# Patient Record
Sex: Female | Born: 1970 | Race: Black or African American | Hispanic: No | Marital: Married | Smoking: Never smoker
Health system: Southern US, Community
[De-identification: ages and names within clinical notes are randomized; demographics above are authoritative.]

## PROBLEM LIST (undated history)

## (undated) DIAGNOSIS — D509 Iron deficiency anemia, unspecified: Secondary | ICD-10-CM

## (undated) DIAGNOSIS — I10 Essential (primary) hypertension: Secondary | ICD-10-CM

## (undated) DIAGNOSIS — Z8742 Personal history of other diseases of the female genital tract: Secondary | ICD-10-CM

## (undated) DIAGNOSIS — M810 Age-related osteoporosis without current pathological fracture: Secondary | ICD-10-CM

## (undated) HISTORY — PX: LAPAROSCOPY: SHX197

---

## 2007-10-17 ENCOUNTER — Ambulatory Visit: Payer: Self-pay | Admitting: Internal Medicine

## 2007-10-26 ENCOUNTER — Ambulatory Visit: Payer: Self-pay | Admitting: Internal Medicine

## 2008-04-30 ENCOUNTER — Ambulatory Visit: Payer: Self-pay | Admitting: General Surgery

## 2008-12-27 ENCOUNTER — Ambulatory Visit: Payer: Self-pay | Admitting: Family Medicine

## 2010-02-04 ENCOUNTER — Ambulatory Visit: Payer: Self-pay | Admitting: Family Medicine

## 2010-02-06 ENCOUNTER — Ambulatory Visit: Payer: Self-pay | Admitting: Nurse Practitioner

## 2016-05-22 ENCOUNTER — Other Ambulatory Visit: Payer: Self-pay | Admitting: Obstetrics and Gynecology

## 2016-05-22 DIAGNOSIS — Z1231 Encounter for screening mammogram for malignant neoplasm of breast: Secondary | ICD-10-CM

## 2016-06-08 ENCOUNTER — Encounter (INDEPENDENT_AMBULATORY_CARE_PROVIDER_SITE_OTHER): Payer: Self-pay

## 2016-06-08 ENCOUNTER — Ambulatory Visit
Admission: RE | Admit: 2016-06-08 | Discharge: 2016-06-08 | Disposition: A | Discharge status: Home / Self Care | Source: Ambulatory Visit | Attending: Obstetrics and Gynecology | Admitting: Obstetrics and Gynecology

## 2016-06-08 DIAGNOSIS — Z1231 Encounter for screening mammogram for malignant neoplasm of breast: Secondary | ICD-10-CM

## 2016-06-08 DIAGNOSIS — R928 Other abnormal and inconclusive findings on diagnostic imaging of breast: Secondary | ICD-10-CM | POA: Insufficient documentation

## 2016-06-10 ENCOUNTER — Other Ambulatory Visit: Payer: Self-pay | Admitting: Obstetrics and Gynecology

## 2016-06-10 DIAGNOSIS — R928 Other abnormal and inconclusive findings on diagnostic imaging of breast: Secondary | ICD-10-CM

## 2016-06-10 DIAGNOSIS — N6320 Unspecified lump in the left breast, unspecified quadrant: Secondary | ICD-10-CM

## 2016-06-23 ENCOUNTER — Ambulatory Visit
Admission: RE | Admit: 2016-06-23 | Discharge: 2016-06-23 | Disposition: A | Discharge status: Home / Self Care | Source: Ambulatory Visit | Attending: Obstetrics and Gynecology | Admitting: Obstetrics and Gynecology

## 2016-06-23 DIAGNOSIS — N6320 Unspecified lump in the left breast, unspecified quadrant: Secondary | ICD-10-CM | POA: Diagnosis not present

## 2016-06-23 DIAGNOSIS — R928 Other abnormal and inconclusive findings on diagnostic imaging of breast: Secondary | ICD-10-CM

## 2016-06-25 ENCOUNTER — Other Ambulatory Visit: Payer: Self-pay | Admitting: Obstetrics and Gynecology

## 2016-06-25 DIAGNOSIS — N6002 Solitary cyst of left breast: Secondary | ICD-10-CM

## 2016-12-25 ENCOUNTER — Other Ambulatory Visit

## 2016-12-25 ENCOUNTER — Ambulatory Visit: Attending: Obstetrics and Gynecology

## 2017-04-07 ENCOUNTER — Ambulatory Visit
Admission: RE | Admit: 2017-04-07 | Discharge: 2017-04-07 | Disposition: A | Discharge status: Home / Self Care | Source: Ambulatory Visit | Attending: Obstetrics and Gynecology | Admitting: Obstetrics and Gynecology

## 2017-04-07 DIAGNOSIS — N6002 Solitary cyst of left breast: Secondary | ICD-10-CM

## 2017-04-07 DIAGNOSIS — N6323 Unspecified lump in the left breast, lower outer quadrant: Secondary | ICD-10-CM | POA: Diagnosis not present

## 2017-04-09 ENCOUNTER — Other Ambulatory Visit: Payer: Self-pay

## 2017-04-09 ENCOUNTER — Ambulatory Visit
Admission: EM | Admit: 2017-04-09 | Discharge: 2017-04-09 | Disposition: A | Discharge status: Home / Self Care | Attending: Family Medicine | Admitting: Family Medicine

## 2017-04-09 ENCOUNTER — Encounter: Payer: Self-pay | Admitting: *Deleted

## 2017-04-09 ENCOUNTER — Ambulatory Visit (INDEPENDENT_AMBULATORY_CARE_PROVIDER_SITE_OTHER)

## 2017-04-09 DIAGNOSIS — M25532 Pain in left wrist: Secondary | ICD-10-CM

## 2017-04-09 HISTORY — DX: Essential (primary) hypertension: I10

## 2017-04-09 MED ORDER — MELOXICAM 15 MG PO TABS: 15.0000 mg | ORAL_TABLET | Freq: Every day | ORAL | 0 refills | Status: DC | PRN

## 2017-04-09 NOTE — Discharge Instructions (Signed)
Take medication as prescribed. Rest. Drink plenty of fluids.  Use a wrist splint as discussed.  Ice and elevate.  Avoid over strenuous repetitive activity.  Follow-up with orthopedic in 1 week for any continued pain or swelling.  Follow up with your primary care physician this week as needed. Return to Urgent care for new or worsening concerns.

## 2017-04-09 NOTE — ED Triage Notes (Signed)
Patient started having left wrist pain 1 month ago. Patient is not sure of mechanism of injury.

## 2017-04-09 NOTE — ED Provider Notes (Signed)
[facility] ____________________________________________  Time seen: Approximately 5:50 PM  I have reviewed the triage vital signs and the nursing notes.   HISTORY  Chief Complaint Wrist Injury   HPI Casey Hatfield is a 46 y.o. female presenting for evaluation of intermittent left wrist pain that is been present for at least 1 month.  States pain comes and goes, more aggravated by repetitive movements.  Denies any associated numbness, paresthesias, decreased strength, redness, skin changes.  Reports there has been some swelling in the same area.  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  Denies associated pain radiation, fall, direct trauma or known trigger.  Reports right-hand dominant.  Denies history of similar in the past.  Reports she has been recently seen by her primary care and had primary care look at wrist as well due to the swelling, and it was suspected subcutaneous tissue.  Patient again reports otherwise feels well denies other complaints.  No over-the-counter medications taken today for the same complaints.  Denies chest pain, shortness of breath, abdominal pain, or rash. Denies recent sickness. Denies recent antibiotic use.   Marisue IvanLinthavong, Kanhka, MD: PCP Patient's last menstrual period was 03/16/2017.Denies pregnancy.    Past Medical History:  Diagnosis Date  . Hypertension     There are no active problems to display for this patient.   Past Surgical History:  Procedure Laterality Date  . LAPAROSCOPY       No current facility-administered medications for this encounter.   Current Outpatient Medications:  .  Ferrous Sulfate (IRON) 325 (65 Fe) MG TABS, Take 1 tablet by mouth daily., Disp: , Rfl:  .  hydrochlorothiazide (MICROZIDE) 12.5 MG capsule, Take 12.5 mg by mouth daily., Disp: , Rfl:  .  Prenatal Vit-Fe Fumarate-FA (PRENATAL MULTIVITAMIN) TABS tablet, Take 1 tablet by mouth daily at 12  noon., Disp: , Rfl:  .  meloxicam (MOBIC) 15 MG tablet, Take 1 tablet (15 mg total) by mouth daily as needed., Disp: 10 tablet, Rfl: 0  Allergies Penicillins and Shellfish allergy  Family History  Problem Relation Age of Onset  . Breast cancer Mother 4855    Social History Social History   Tobacco Use  . Smoking status: Never Smoker  . Smokeless tobacco: Never Used  Substance Use Topics  . Alcohol use: No    Frequency: Never  . Drug use: No    Review of Systems Constitutional: No fever/chills Cardiovascular: Denies chest pain. Respiratory: Denies shortness of breath. Gastrointestinal: No abdominal pain.   Genitourinary: Negative for dysuria. Musculoskeletal: Negative for back pain. As above.  Skin: Negative for rash.  ____________________________________________   PHYSICAL EXAM:  VITAL SIGNS: ED Triage Vitals  Enc Vitals Group     BP 04/09/17 1618 (!) 141/73     Pulse Rate 04/09/17 1618 77     Resp 04/09/17 1618 16     Temp 04/09/17 1618 98.8 F (37.1 C)     Temp Source 04/09/17 1618 Oral     SpO2 04/09/17 1618 100 %     Weight 04/09/17 1619 230 lb (104.3 kg)     Height 04/09/17 1619 5\' 5"  (1.651 m)     Head Circumference --      Peak Flow --      Pain Score 04/09/17 1619 0     Pain Loc --      Pain Edu? --      Excl. in [place]? --  Constitutional: Alert and oriented. Well appearing and in no acute distress. Cardiovascular: Normal rate, regular rhythm. Grossly normal heart sounds.  Good peripheral circulation. Respiratory: Normal respiratory effort without tachypnea nor retractions. Breath sounds are clear and equal bilaterally. No wheezes, rales, rhonchi. Musculoskeletal:. No midline cervical, thoracic or lumbar tenderness to palpation. Bilateral distal radial pulses equal and easily palpated.  Bilateral hand grip strong and equal.   Except: Left wrist mild diffuse tenderness to palpation with increased pain during resisted flexion and extension, full range  of motion present, no erythema, no break in skin, left hand normal distal sensation and capillary refill, no motor or tendon deficit noted to left hand, localized area in the dorsal left wrist of localized edema, body habitus consistent with similar appearance to right dorsal wrist however left more edematous, left upper extremity otherwise nontender.  Negative Tinel's, negative Phalen's. Neurologic:  Normal speech and language. No gross focal neurologic deficits are appreciated. Speech is normal. No gait instability.  Skin:  Skin is warm, dry and intact. No rash noted. Psychiatric: Mood and affect are normal. Speech and behavior are normal. Patient exhibits appropriate insight and judgment   ___________________________________________   LABS (all labs ordered are listed, but only abnormal results are displayed)  Labs Reviewed - No data to display  RADIOLOGY  Dg Wrist Complete Left  Result Date: 04/09/2017 CLINICAL DATA:  Intermittent pain and swelling EXAM: LEFT WRIST - COMPLETE 3+ VIEW COMPARISON:  None. FINDINGS: There is no evidence of fracture or dislocation. There is no evidence of arthropathy or other focal bone abnormality. Soft tissues are unremarkable. IMPRESSION: Negative. Electronically Signed   By: Jasmine PangKim  Fujinaga M.D.   On: 04/09/2017 18:08   ____________________________________________   PROCEDURES Procedures     INITIAL IMPRESSION / ASSESSMENT AND PLAN / ED COURSE  Pertinent labs & imaging results that were available during my care of the patient were reviewed by me and considered in my medical decision making (see chart for details).  Well appearing patient.  No acute distress.  Intermittent left wrist pain over 1 month.  Denies known trauma.  Repetitive motions.  Suspect strain and tendinitis.  Discussed in detail with patient supportive care.  Left wrist x-ray negative per radiologist.  Encouraged ice, elevation, cockup Velcro splint to encourage rest, stretching,  daily Mobic.  Discussed need to follow-up for further evaluation and treatment with orthopedic if swelling and pain continue.  Information given.Discussed indication, risks and benefits of medications with patient.  Discussed follow up with Primary care physician this week as needed. Discussed follow up and return parameters including no resolution or any worsening concerns. Patient verbalized understanding and agreed to plan.   ____________________________________________   FINAL CLINICAL IMPRESSION(S) / ED DIAGNOSES  Final diagnoses:  Left wrist pain     ED Discharge Orders        Ordered    meloxicam (MOBIC) 15 MG tablet  Daily PRN     04/09/17 1830       Note: This dictation was prepared with Dragon dictation along with smaller phrase technology. Any transcriptional errors that result from this process are unintentional.         Renford DillsMiller, Keirstyn Aydt, NP 04/09/17 [phone]

## 2017-06-30 ENCOUNTER — Other Ambulatory Visit: Payer: Self-pay | Admitting: Obstetrics and Gynecology

## 2017-06-30 DIAGNOSIS — Z1231 Encounter for screening mammogram for malignant neoplasm of breast: Secondary | ICD-10-CM

## 2017-06-30 DIAGNOSIS — N6320 Unspecified lump in the left breast, unspecified quadrant: Secondary | ICD-10-CM

## 2017-07-16 ENCOUNTER — Other Ambulatory Visit

## 2017-07-16 ENCOUNTER — Encounter

## 2017-08-24 ENCOUNTER — Ambulatory Visit
Admission: RE | Admit: 2017-08-24 | Discharge: 2017-08-24 | Disposition: A | Discharge status: Home / Self Care | Source: Ambulatory Visit | Attending: Obstetrics and Gynecology | Admitting: Obstetrics and Gynecology

## 2017-08-24 DIAGNOSIS — Z1231 Encounter for screening mammogram for malignant neoplasm of breast: Secondary | ICD-10-CM

## 2017-08-24 DIAGNOSIS — N6320 Unspecified lump in the left breast, unspecified quadrant: Secondary | ICD-10-CM

## 2017-12-07 IMAGING — MG MM DIGITAL DIAGNOSTIC UNILAT*L* W/ TOMO W/ CAD
6 series · 6 of 14 positions shown · non-contrast
Comparison: Previous exam(s).

ACR Breast Density Category a: The breast tissue is almost entirely
fatty.

CLINICAL DATA: 45-year-old female for further evaluation of
possible left breast mass on screening mammogram.

EXAM:
2D DIGITAL DIAGNOSTIC LEFT MAMMOGRAM WITH CAD AND ADJUNCT TOMO
ULTRASOUND LEFT BREAST

[L MLO]
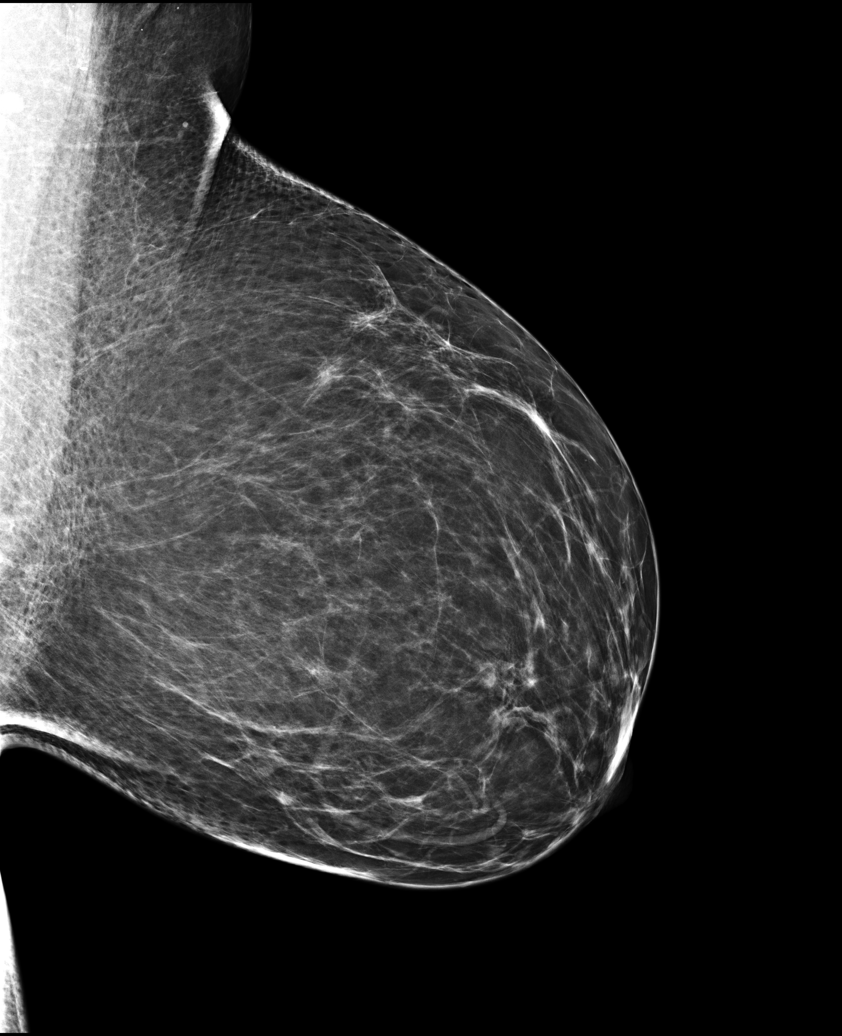

[L CC synth-2D]
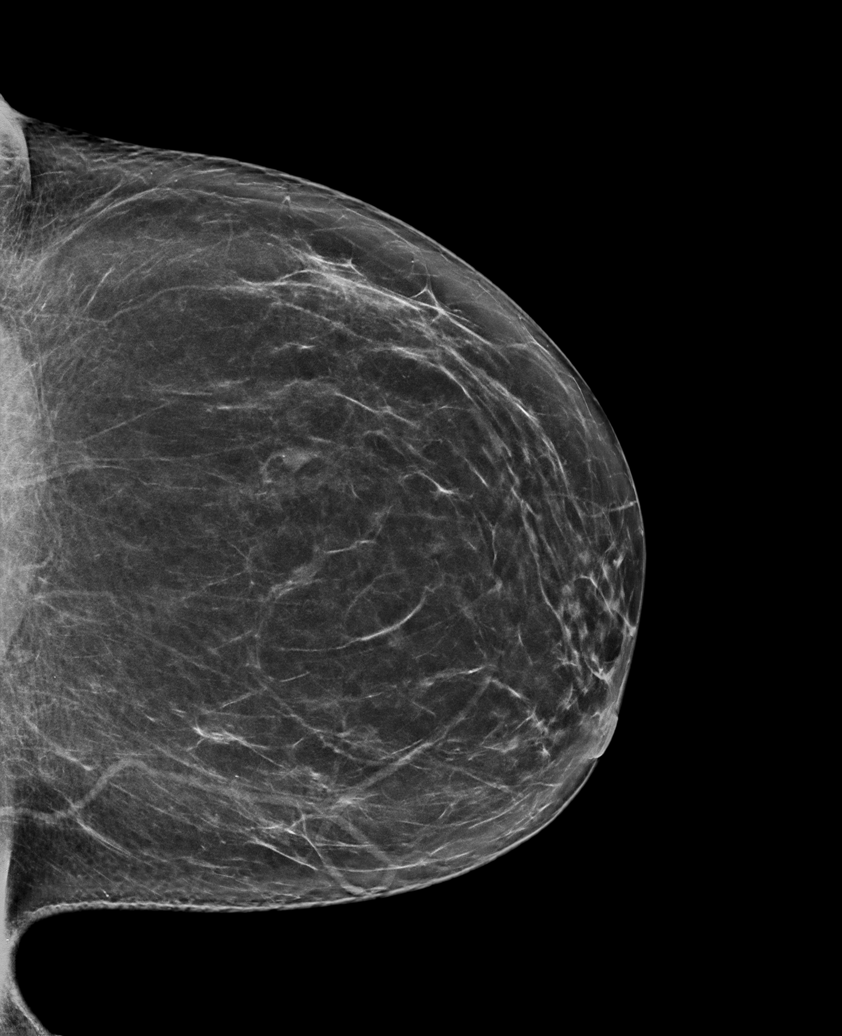

[L CC]
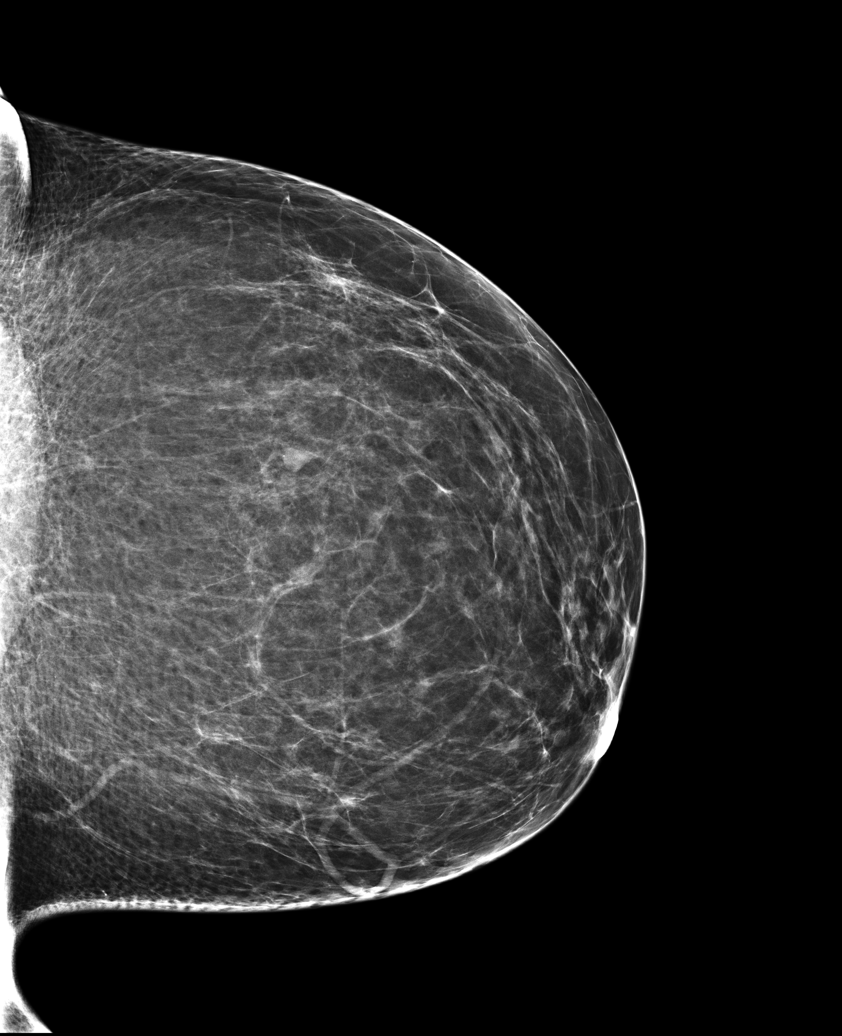

[L MLO synth-2D]
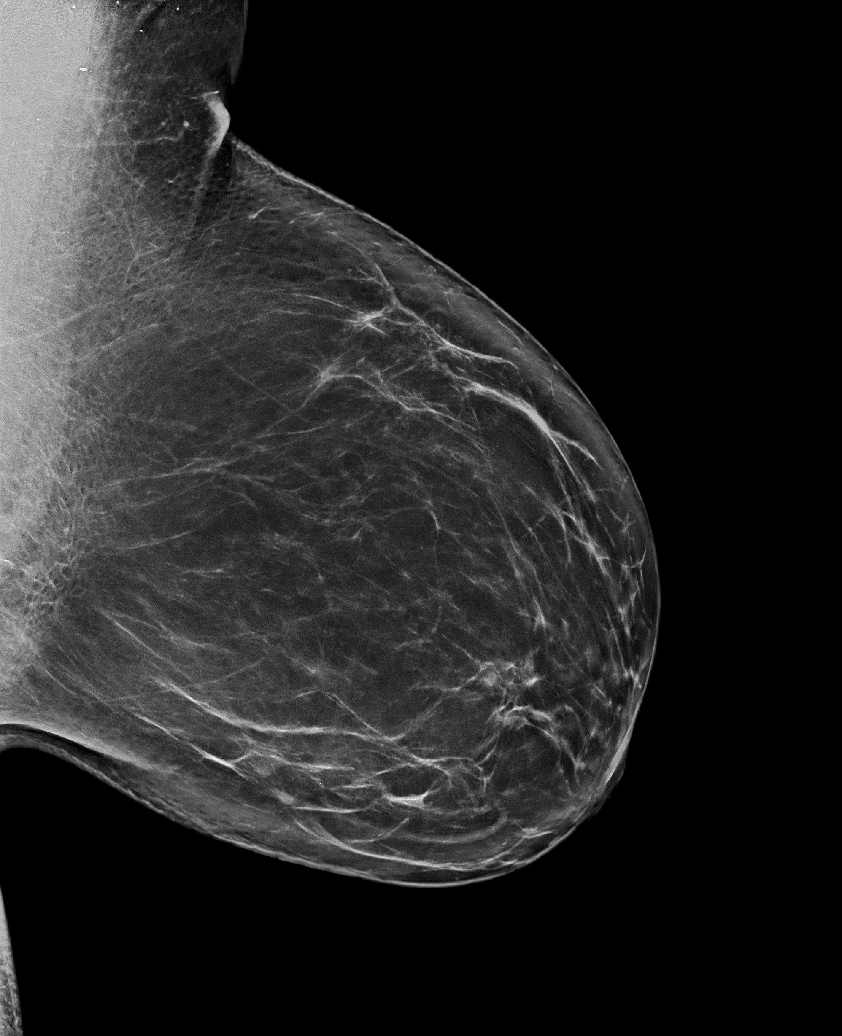

[L CC tomo · tomo slice 41/81.0]
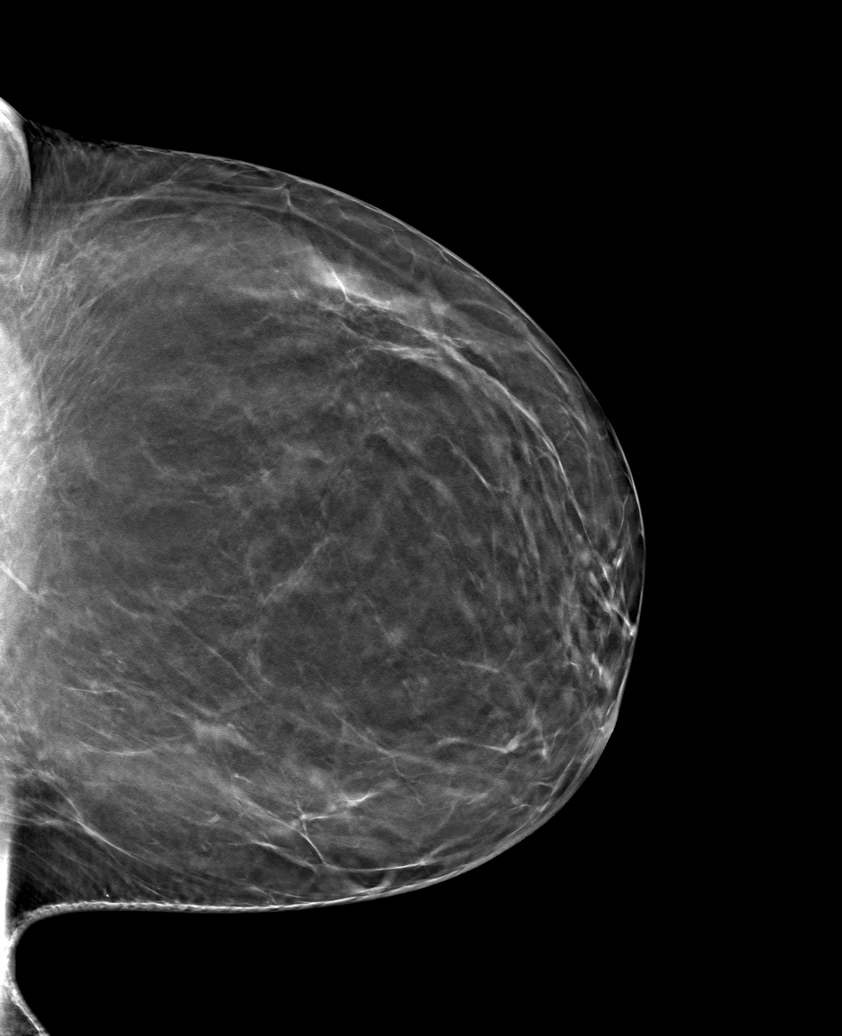

[L MLO tomo · tomo slice 49/97.0]
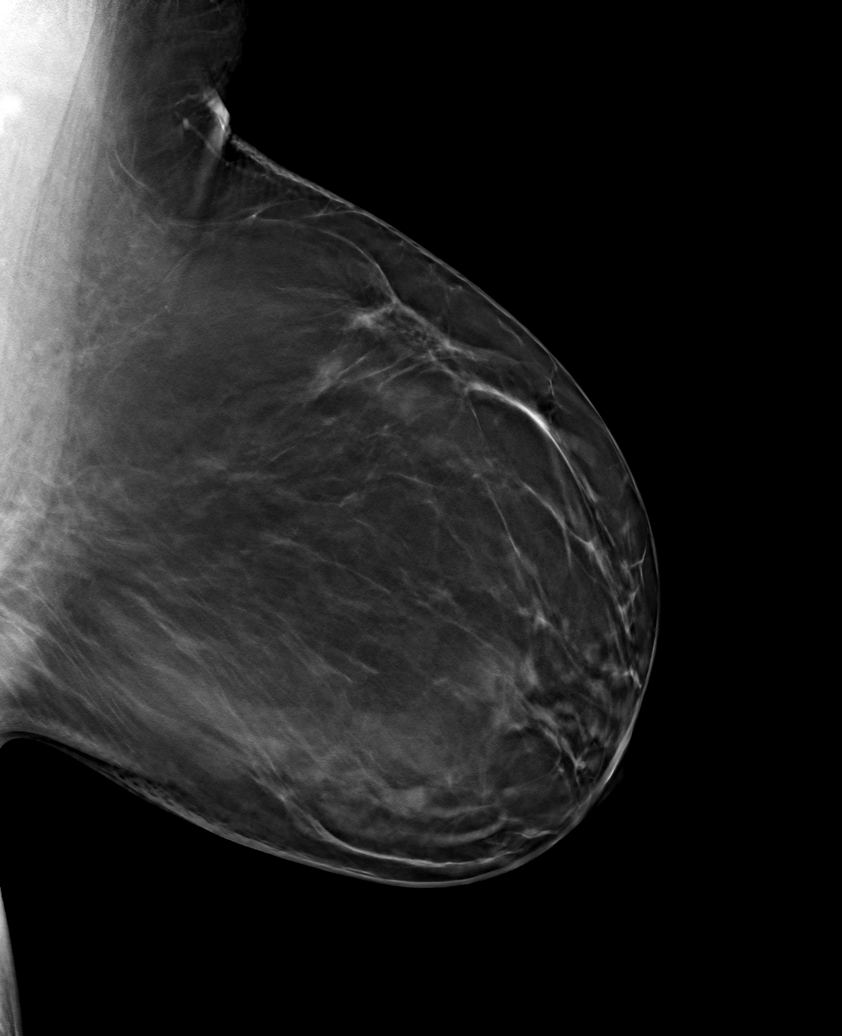

[6 of 14 positions shown; findings below may reference images not displayed]

FINDINGS: 2D and 3D full field views of the left breast demonstrate a 5 mm
circumscribed oval low-density mass within the lower outer left
breast, corresponding to the screening study finding.

Mammographic images were processed with CAD.

On physical exam, no palpable abnormalities identified.

Targeted ultrasound is performed, showing a 5 x 4 x 5 mm
circumscribed oval near anechoic mass at the 4 o'clock position of
the left breast 8 cm from the nipple probably representing a
complicated cyst and likely corresponding to the screening study
finding.
IMPRESSION: Likely benign complicated cyst within the lower outer left breast.
Six-month followup is recommended to ensure stability.

RECOMMENDATION:
Left diagnostic mammogram with possible left breast ultrasound in 6
months.

I have discussed the findings and recommendations with the patient.
Results were also provided in writing at the conclusion of the
visit. If applicable, a reminder letter will be sent to the patient
regarding the next appointment.

BI-RADS CATEGORY  3: Probably benign.

## 2018-07-07 ENCOUNTER — Other Ambulatory Visit: Payer: Self-pay | Admitting: Obstetrics and Gynecology

## 2018-07-07 DIAGNOSIS — Z1231 Encounter for screening mammogram for malignant neoplasm of breast: Secondary | ICD-10-CM

## 2018-07-07 DIAGNOSIS — N6320 Unspecified lump in the left breast, unspecified quadrant: Secondary | ICD-10-CM

## 2018-08-26 ENCOUNTER — Encounter

## 2018-08-26 ENCOUNTER — Other Ambulatory Visit

## 2018-09-08 ENCOUNTER — Ambulatory Visit
Admission: RE | Admit: 2018-09-08 | Discharge: 2018-09-08 | Disposition: A | Discharge status: Home / Self Care | Source: Ambulatory Visit | Attending: Obstetrics and Gynecology | Admitting: Obstetrics and Gynecology

## 2018-09-08 ENCOUNTER — Other Ambulatory Visit: Payer: Self-pay

## 2018-09-08 DIAGNOSIS — N6320 Unspecified lump in the left breast, unspecified quadrant: Secondary | ICD-10-CM | POA: Diagnosis not present

## 2018-09-08 DIAGNOSIS — Z1231 Encounter for screening mammogram for malignant neoplasm of breast: Secondary | ICD-10-CM

## 2018-12-23 ENCOUNTER — Observation Stay

## 2018-12-23 ENCOUNTER — Emergency Department

## 2018-12-23 ENCOUNTER — Inpatient Hospital Stay
Admission: EM | Admit: 2018-12-23 | Discharge: 2018-12-26 | DRG: 419 | Disposition: A | Discharge status: Home / Self Care | Attending: Surgery | Admitting: Surgery

## 2018-12-23 ENCOUNTER — Encounter: Payer: Self-pay | Admitting: Emergency Medicine

## 2018-12-23 ENCOUNTER — Other Ambulatory Visit: Payer: Self-pay

## 2018-12-23 DIAGNOSIS — I10 Essential (primary) hypertension: Secondary | ICD-10-CM | POA: Diagnosis present

## 2018-12-23 DIAGNOSIS — Z20828 Contact with and (suspected) exposure to other viral communicable diseases: Secondary | ICD-10-CM | POA: Diagnosis present

## 2018-12-23 DIAGNOSIS — Z791 Long term (current) use of non-steroidal anti-inflammatories (NSAID): Secondary | ICD-10-CM

## 2018-12-23 DIAGNOSIS — Z880 Allergy status to penicillin: Secondary | ICD-10-CM | POA: Diagnosis not present

## 2018-12-23 DIAGNOSIS — K82A1 Gangrene of gallbladder in cholecystitis: Secondary | ICD-10-CM | POA: Diagnosis not present

## 2018-12-23 DIAGNOSIS — K660 Peritoneal adhesions (postprocedural) (postinfection): Secondary | ICD-10-CM | POA: Diagnosis present

## 2018-12-23 DIAGNOSIS — Z8249 Family history of ischemic heart disease and other diseases of the circulatory system: Secondary | ICD-10-CM

## 2018-12-23 DIAGNOSIS — R112 Nausea with vomiting, unspecified: Secondary | ICD-10-CM | POA: Diagnosis present

## 2018-12-23 DIAGNOSIS — K819 Cholecystitis, unspecified: Secondary | ICD-10-CM | POA: Diagnosis present

## 2018-12-23 DIAGNOSIS — R109 Unspecified abdominal pain: Secondary | ICD-10-CM

## 2018-12-23 DIAGNOSIS — K8309 Other cholangitis: Secondary | ICD-10-CM

## 2018-12-23 DIAGNOSIS — K8000 Calculus of gallbladder with acute cholecystitis without obstruction: Secondary | ICD-10-CM | POA: Diagnosis not present

## 2018-12-23 DIAGNOSIS — Z23 Encounter for immunization: Secondary | ICD-10-CM

## 2018-12-23 DIAGNOSIS — E876 Hypokalemia: Secondary | ICD-10-CM | POA: Diagnosis present

## 2018-12-23 DIAGNOSIS — Z803 Family history of malignant neoplasm of breast: Secondary | ICD-10-CM

## 2018-12-23 DIAGNOSIS — Z91013 Allergy to seafood: Secondary | ICD-10-CM | POA: Diagnosis not present

## 2018-12-23 DIAGNOSIS — K8020 Calculus of gallbladder without cholecystitis without obstruction: Secondary | ICD-10-CM

## 2018-12-23 DIAGNOSIS — Z833 Family history of diabetes mellitus: Secondary | ICD-10-CM | POA: Diagnosis not present

## 2018-12-23 LAB — URINE DRUG SCREEN, QUALITATIVE (ARMC ONLY)
Amphetamines, Ur Screen: NOT DETECTED
Barbiturates, Ur Screen: NOT DETECTED
Benzodiazepine, Ur Scrn: NOT DETECTED
Cannabinoid 50 Ng, Ur ~~LOC~~: NOT DETECTED
Cocaine Metabolite,Ur ~~LOC~~: NOT DETECTED
MDMA (Ecstasy)Ur Screen: NOT DETECTED
Methadone Scn, Ur: NOT DETECTED
Opiate, Ur Screen: NOT DETECTED
Phencyclidine (PCP) Ur S: NOT DETECTED
Tricyclic, Ur Screen: NOT DETECTED

## 2018-12-23 LAB — COMPREHENSIVE METABOLIC PANEL
ALT: 49 U/L — ABNORMAL HIGH (ref 0–44)
AST: 46 U/L — ABNORMAL HIGH (ref 15–41)
Albumin: 4.7 g/dL (ref 3.5–5.0)
Alkaline Phosphatase: 121 U/L (ref 38–126)
Anion gap: 11 (ref 5–15)
BUN: 10 mg/dL (ref 6–20)
CO2: 26 mmol/L (ref 22–32)
Calcium: 9.5 mg/dL (ref 8.9–10.3)
Chloride: 98 mmol/L (ref 98–111)
Creatinine, Ser: 0.91 mg/dL (ref 0.44–1.00)
GFR calc Af Amer: >60 mL/min (ref >60)
GFR calc non Af Amer: >60 mL/min (ref >60)
Glucose, Bld: 132 mg/dL — ABNORMAL HIGH (ref 70–99)
Potassium: 3.3 mmol/L — ABNORMAL LOW (ref 3.5–5.1)
Sodium: 135 mmol/L (ref 135–145)
Total Bilirubin: 0.6 mg/dL (ref 0.3–1.2)
Total Protein: 9.4 g/dL — ABNORMAL HIGH (ref 6.5–8.1)

## 2018-12-23 LAB — URINALYSIS, COMPLETE (UACMP) WITH MICROSCOPIC
Bilirubin Urine: NEGATIVE
Glucose, UA: NEGATIVE mg/dL
Hgb urine dipstick: NEGATIVE
Ketones, ur: NEGATIVE mg/dL
Leukocytes,Ua: NEGATIVE
Nitrite: NEGATIVE
Protein, ur: NEGATIVE mg/dL
Specific Gravity, Urine: 1.015 (ref 1.005–1.030)
pH: 8 (ref 5.0–8.0)

## 2018-12-23 LAB — CBC
HCT: 36.9 % (ref 36.0–46.0)
Hemoglobin: 11.8 g/dL — ABNORMAL LOW (ref 12.0–15.0)
MCH: 27.5 pg (ref 26.0–34.0)
MCHC: 32 g/dL (ref 30.0–36.0)
MCV: 86 fL (ref 80.0–100.0)
Platelets: 381 10*3/uL (ref 150–400)
RBC: 4.29 MIL/uL (ref 3.87–5.11)
RDW: 13.6 % (ref 11.5–15.5)
WBC: 9 10*3/uL (ref 4.0–10.5)
nRBC: 0 % (ref 0.0–0.2)

## 2018-12-23 LAB — LIPASE, BLOOD: Lipase: 36 U/L (ref 11–51)

## 2018-12-23 LAB — PREGNANCY, URINE: Preg Test, Ur: NEGATIVE

## 2018-12-23 LAB — MAGNESIUM: Magnesium: 1.9 mg/dL (ref 1.7–2.4)

## 2018-12-23 LAB — SARS CORONAVIRUS 2 BY RT PCR (HOSPITAL ORDER, PERFORMED IN ~~LOC~~ HOSPITAL LAB): SARS Coronavirus 2: NEGATIVE

## 2018-12-23 MED ORDER — POTASSIUM CHLORIDE IN NACL 20-0.9 MEQ/L-% IV SOLN
INTRAVENOUS | Status: DC
  Administered 2018-12-23 – 2018-12-25 (×3): via INTRAVENOUS
  Filled 2018-12-23 (×6): qty 1000

## 2018-12-23 MED ORDER — SODIUM CHLORIDE 0.9 % IV BOLUS
1000.0000 mL | Freq: Once | INTRAVENOUS | Status: AC
  Administered 2018-12-23: 14:00:00 1000 mL via INTRAVENOUS

## 2018-12-23 MED ORDER — ENOXAPARIN SODIUM 40 MG/0.4ML ~~LOC~~ SOLN
40.0000 mg | SUBCUTANEOUS | Status: DC
  Administered 2018-12-23 – 2018-12-25 (×3): 40 mg via SUBCUTANEOUS
  Filled 2018-12-23 (×3): qty 0.4

## 2018-12-23 MED ORDER — SODIUM CHLORIDE 0.9% FLUSH: 3.0000 mL | Freq: Once | INTRAVENOUS | Status: DC

## 2018-12-23 MED ORDER — ACETAMINOPHEN 650 MG RE SUPP: 650.0000 mg | Freq: Four times a day (QID) | RECTAL | Status: DC | PRN

## 2018-12-23 MED ORDER — PROMETHAZINE HCL 25 MG/ML IJ SOLN
12.5000 mg | Freq: Once | INTRAMUSCULAR | Status: AC
  Administered 2018-12-23: 15:00:00 12.5 mg via INTRAVENOUS
  Filled 2018-12-23: qty 1

## 2018-12-23 MED ORDER — MORPHINE SULFATE (PF) 2 MG/ML IV SOLN
2.0000 mg | INTRAVENOUS | Status: DC | PRN
  Administered 2018-12-23 – 2018-12-24 (×3): 2 mg via INTRAVENOUS
  Filled 2018-12-23 (×3): qty 1

## 2018-12-23 MED ORDER — INFLUENZA VAC SPLIT QUAD 0.5 ML IM SUSY
0.5000 mL | PREFILLED_SYRINGE | INTRAMUSCULAR | Status: AC
  Administered 2018-12-26: 0.5 mL via INTRAMUSCULAR
  Filled 2018-12-23: qty 0.5

## 2018-12-23 MED ORDER — ONDANSETRON HCL 4 MG/2ML IJ SOLN
4.0000 mg | Freq: Four times a day (QID) | INTRAMUSCULAR | Status: DC | PRN
  Administered 2018-12-23 – 2018-12-24 (×2): 4 mg via INTRAVENOUS
  Filled 2018-12-23 (×2): qty 2

## 2018-12-23 MED ORDER — PROMETHAZINE HCL 25 MG/ML IJ SOLN
12.5000 mg | Freq: Once | INTRAMUSCULAR | Status: AC
  Administered 2018-12-23: 12.5 mg via INTRAVENOUS
  Filled 2018-12-23: qty 1

## 2018-12-23 MED ORDER — MORPHINE SULFATE (PF) 4 MG/ML IV SOLN
4.0000 mg | Freq: Once | INTRAVENOUS | Status: AC
  Administered 2018-12-23: 4 mg via INTRAVENOUS
  Filled 2018-12-23: qty 1

## 2018-12-23 MED ORDER — ONDANSETRON HCL 4 MG PO TABS: 4.0000 mg | ORAL_TABLET | Freq: Four times a day (QID) | ORAL | Status: DC | PRN

## 2018-12-23 MED ORDER — GADOBUTROL 1 MMOL/ML IV SOLN
10.0000 mL | Freq: Once | INTRAVENOUS | Status: AC | PRN
  Administered 2018-12-23: 21:00:00 10 mL via INTRAVENOUS

## 2018-12-23 MED ORDER — ACETAMINOPHEN 325 MG PO TABS: 650.0000 mg | ORAL_TABLET | Freq: Four times a day (QID) | ORAL | Status: DC | PRN

## 2018-12-23 MED ORDER — IOHEXOL 300 MG/ML  SOLN
100.0000 mL | Freq: Once | INTRAMUSCULAR | Status: AC | PRN
  Administered 2018-12-23: 100 mL via INTRAVENOUS

## 2018-12-23 MED ORDER — ONDANSETRON HCL 4 MG/2ML IJ SOLN
4.0000 mg | Freq: Once | INTRAMUSCULAR | Status: AC
  Administered 2018-12-23: 14:00:00 4 mg via INTRAVENOUS
  Filled 2018-12-23: qty 2

## 2018-12-23 NOTE — ED Notes (Signed)
Patient transported to CT 

## 2018-12-23 NOTE — ED Notes (Signed)
Pt heard vomiting from bathroom. Primary RN notified.

## 2018-12-23 NOTE — ED Triage Notes (Signed)
Pt here with c/o mid abdomen pain and vomiting that began this am, some body aches as well. NAD.

## 2018-12-23 NOTE — Plan of Care (Signed)
Support patients hydration status and provide interventions to combat nausea

## 2018-12-23 NOTE — ED Provider Notes (Signed)
[facility] Emergency Department Provider Note  ____________________________________________   First MD Initiated Contact with Patient 12/23/18 1334     (approximate)  I have reviewed the triage vital signs and the nursing notes.  History  Chief Complaint Abdominal Pain and Emesis    HPI Casey Hatfield CAYLAN CHENARD is a 48 y.o. female with history of hypertension who presents to the emergency department for abdominal pain, nausea, vomiting.  Symptoms started this morning.  Pain is cramping in quality, primarily localized to her mid-abdomen and periumbilical area.  She denies any fevers, but does report chills.  No diarrhea or blood stool.  She denies any urinary symptoms.  She denies any sick contacts, or history of similar symptoms.  No recent travel, new foods, or antibiotics.         Past Medical Hx Past Medical History:  Diagnosis Date  . Hypertension     Problem List There are no active problems to display for this patient.   Past Surgical Hx Past Surgical History:  Procedure Laterality Date  . LAPAROSCOPY      Medications Prior to Admission medications   Medication Sig Start Date End Date Taking? Authorizing Provider  Ferrous Sulfate (IRON) 325 (65 Fe) MG TABS Take 1 tablet by mouth daily.    [provider]  hydrochlorothiazide (MICROZIDE) 12.5 MG capsule Take 12.5 mg by mouth daily.    [provider]  meloxicam (MOBIC) 15 MG tablet Take 1 tablet (15 mg total) by mouth daily as needed. 04/09/17   Marylene Land, NP  Prenatal Vit-Fe Fumarate-FA (PRENATAL MULTIVITAMIN) TABS tablet Take 1 tablet by mouth daily at 12 noon.    [provider]    Allergies Penicillins and Shellfish allergy  Family Hx Family History  Problem Relation Age of Onset  . Breast cancer Mother 58    Social Hx Social History   Tobacco Use  . Smoking status: Never Smoker  . Smokeless tobacco: Never Used  Substance Use Topics  .  Alcohol use: No    Frequency: Never  . Drug use: No     Review of Systems  Constitutional: Negative for fever. Negative for chills. Eyes: Negative for visual changes. ENT: Negative for sore throat. Cardiovascular: Negative for chest pain. Respiratory: Negative for shortness of breath. Gastrointestinal: + for abdominal pain. + for nausea. + for vomiting. Genitourinary: Negative for dysuria. Musculoskeletal: Negative for leg swelling. Skin: Negative for rash. Neurological: Negative for for headaches.   Physical Exam  Vital Signs: ED Triage Vitals [12/23/18 1142]  Enc Vitals Group     BP 134/88     Pulse Rate 82     Resp 16     Temp 98.7 F (37.1 C)     Temp Source Oral     SpO2 100 %     Weight 230 lb (104.3 kg)     Height 5\' 6"  (1.676 m)     Head Circumference      Peak Flow      Pain Score 6     Pain Loc      Pain Edu?      Excl. in [place]?     Constitutional: Alert and oriented.  Dry heaving during exam. Eyes: Conjunctivae clear. Sclera anicteric. Head: Normocephalic. Atraumatic. Nose: No congestion. No rhinorrhea. Mouth/Throat: Mucous membranes are dry.  Neck: No stridor.   Cardiovascular: Normal rate, regular rhythm. No murmurs. Extremities well perfused. Respiratory: Normal respiratory effort.  Lungs CTAB. Gastrointestinal: Soft, nondistended.  Mild  tenderness centrally and to upper abdomen.  No rebound or guarding. Musculoskeletal: No lower extremity edema. Neurologic:  Normal speech and language. No gross focal neurologic deficits are appreciated.  Skin: Skin is warm, dry and intact. No rash noted. Psychiatric: Mood and affect are appropriate for situation.  EKG  Personally reviewed.   Rate: 74 Rhythm: sinus Axis: normal Intervals: WNL No acute ischemic changes No STEMI    Radiology  CT A/P: IMPRESSION: 1. No acute findings. 2. Cholelithiasis. No radiographic evidence of cholecystitis.  US: IMPRESSION: Cholelithiasis without secondary  signs to suggest acute  cholecystitis. Common bile duct is mildly dilated. Recommend correlation with LFTs.    Procedures  Procedure(s) performed (including critical care):  Procedures   Initial Impression / Assessment and Plan / ED Course  48 y.o. female who presents to the ED for abdominal pain, nausea, vomiting.  Ddx: viral process, UTI, gastritis, appendicitis, biliary colic   Plan: labs, urine, imaging, fluids/symptoms and reassess  Work-up reveals very minimally elevated AST, ALT to 46, 49, respectively.  Normal bilirubin and lipase.  Urine pregnancy negative, UDS negative, COVID negative.  CT reveals cholelithiasis, no other acute findings.  Follow-up ultrasound notes cholelithiasis, no secondary signs of cholecystitis, mildly dilated common bile duct.  Patient still has persistence of abdominal pain, nausea, and vomiting despite fluids and multiple rounds of symptom control.  Discussed case with hospitalist for admission for further control, as well as GI given the mildly dilated common bile duct. MRCP ordered, GI aware. Patient agreeable w/ plan.     Final Clinical Impression(s) / ED Diagnosis  Final diagnoses:  Nausea and vomiting in adult patient  Abdominal pain  Gallstones       Note:  This document was prepared using Dragon voice recognition software and may include unintentional dictation errors.   Miguel AschoffMonks, Sully Dyment L., MD 12/23/18 2129

## 2018-12-23 NOTE — ED Notes (Signed)
ED TO INPATIENT HANDOFF REPORT  ED Nurse Name and Phone #: Wells Guiles 36  S Name/Age/Gender Casey Hatfield 48 y.o. female Room/Bed: ED18A/ED18A  Code Status   Code Status: Not on file  Home/SNF/Other Home Patient oriented to: self, place, time and situation Is this baseline? Yes   Triage Complete: Triage complete  Chief Complaint Abdominal Pain; Fatigue  Triage Note Pt here with c/o mid abdomen pain and vomiting that began this am, some body aches as well. NAD.    Allergies Allergies  Allergen Reactions  . Penicillins Hives  . Shellfish Allergy Anaphylaxis    Level of Care/Admitting Diagnosis ED Disposition    ED Disposition Condition [facility]: [place] [[id number]]  Level of Care: Med-Surg [16]  Covid Evaluation: Asymptomatic Screening Protocol (No Symptoms)  Diagnosis: Intractable nausea and vomiting [355732]  Admitting Physician: Henreitta Leber [[id number]]  Attending Physician: Henreitta Leber [[id number]]  PT Class (Do Not Modify): Observation [104]  PT Acc Code (Do Not Modify): Observation [10022]       B Medical/Surgery History Past Medical History:  Diagnosis Date  . Hypertension    Past Surgical History:  Procedure Laterality Date  . LAPAROSCOPY       A IV Location/Drains/Wounds Patient Lines/Drains/Airways Status   Active Line/Drains/Airways    Name:   Placement date:   Placement time:   Site:   Days:   Peripheral IV 12/23/18 Right Antecubital   12/23/18    1345    Antecubital   less than 1          Intake/Output Last 24 hours  Intake/Output Summary (Last 24 hours) at 12/23/2018 2051 Last data filed at 12/23/2018 1533 Gross per 24 hour  Intake 1000 ml  Output -  Net 1000 ml    Labs/Imaging Results for orders placed or performed during the hospital encounter of 12/23/18 (from the past 48 hour(s))  Lipase, blood     Status: None   Collection Time: 12/23/18 11:49 AM  Result Value Ref Range    Lipase 36 11 - 51 U/L    Comment: Performed at [facility], [address] [place], [place] [address]  Comprehensive metabolic panel     Status: Abnormal   Collection Time: 12/23/18 11:49 AM  Result Value Ref Range   Sodium 135 135 - 145 mmol/L   Potassium 3.3 (L) 3.5 - 5.1 mmol/L   Chloride 98 98 - 111 mmol/L   CO2 26 22 - 32 mmol/L   Glucose, Bld 132 (H) 70 - 99 mg/dL   BUN 10 6 - 20 mg/dL   Creatinine, Ser 0.91 0.44 - 1.00 mg/dL   Calcium 9.5 8.9 - 10.3 mg/dL   Total Protein 9.4 (H) 6.5 - 8.1 g/dL   Albumin 4.7 3.5 - 5.0 g/dL   AST 46 (H) 15 - 41 U/L   ALT 49 (H) 0 - 44 U/L   Alkaline Phosphatase 121 38 - 126 U/L   Total Bilirubin 0.6 0.3 - 1.2 mg/dL   GFR calc non Af Amer >60 >60 mL/min   GFR calc Af Amer >60 >60 mL/min   Anion gap 11 5 - 15    Comment: Performed at [facility] [address] [place], [place] [address]  CBC     Status: Abnormal   Collection Time: 12/23/18 11:49 AM  Result Value Ref Range   WBC 9.0 4.0 - 10.5 K/uL   RBC 4.29 3.87 - 5.11  MIL/uL   Hemoglobin 11.8 (L) 12.0 - 15.0 g/dL   HCT 16.136.9 09.636.0 - 04.546.0 %   MCV 86.0 80.0 - 100.0 fL   MCH 27.5 26.0 - 34.0 pg   MCHC 32.0 30.0 - 36.0 g/dL   RDW 40.913.6 81.111.5 - 91.415.5 %   Platelets 381 150 - 400 K/uL   nRBC 0.0 0.0 - 0.2 %    Comment: Performed at [facility], [address] [place], [place] [address]  Magnesium     Status: None   Collection Time: 12/23/18 11:49 AM  Result Value Ref Range   Magnesium 1.9 1.7 - 2.4 mg/dL    Comment: Performed at [facility], [address] [place], [place] [address]  Urinalysis, Complete w Microscopic     Status: Abnormal   Collection Time: 12/23/18 11:50 AM  Result Value Ref Range   Color, Urine STRAW (A) YELLOW   APPearance CLEAR (A) CLEAR   Specific Gravity, Urine 1.015 1.005 - 1.030   pH 8.0 5.0 - 8.0   Glucose, UA NEGATIVE NEGATIVE mg/dL   Hgb urine dipstick NEGATIVE NEGATIVE   Bilirubin Urine NEGATIVE NEGATIVE    Ketones, ur NEGATIVE NEGATIVE mg/dL   Protein, ur NEGATIVE NEGATIVE mg/dL   Nitrite NEGATIVE NEGATIVE   Leukocytes,Ua NEGATIVE NEGATIVE   RBC / HPF 0-5 0 - 5 RBC/hpf   WBC, UA 0-5 0 - 5 WBC/hpf   Bacteria, UA RARE (A) NONE SEEN   Squamous Epithelial / LPF 0-5 0 - 5   Mucus PRESENT     Comment: Performed at [facility], [address] [place], [place] [address]  Pregnancy, urine     Status: None   Collection Time: 12/23/18 11:50 AM  Result Value Ref Range   Preg Test, Ur NEGATIVE NEGATIVE    Comment: Performed at [facility], [address] [place], [place] [address]  Urine Drug Screen, Qualitative     Status: None   Collection Time: 12/23/18 11:50 AM  Result Value Ref Range   Tricyclic, Ur Screen NONE DETECTED NONE DETECTED   Amphetamines, Ur Screen NONE DETECTED NONE DETECTED   MDMA (Ecstasy)Ur Screen NONE DETECTED NONE DETECTED   Cocaine Metabolite,Ur [place] NONE DETECTED NONE DETECTED   Opiate, Ur Screen NONE DETECTED NONE DETECTED   Phencyclidine (PCP) Ur S NONE DETECTED NONE DETECTED   Cannabinoid 50 Ng, Ur [place] NONE DETECTED NONE DETECTED   Barbiturates, Ur Screen NONE DETECTED NONE DETECTED   Benzodiazepine, Ur Scrn NONE DETECTED NONE DETECTED   Methadone Scn, Ur NONE DETECTED NONE DETECTED    Comment: (NOTE) Tricyclics + metabolites, urine    Cutoff 1000 ng/mL Amphetamines + metabolites, urine  Cutoff 1000 ng/mL MDMA (Ecstasy), urine              Cutoff 500 ng/mL Cocaine Metabolite, urine          Cutoff 300 ng/mL Opiate + metabolites, urine        Cutoff 300 ng/mL Phencyclidine (PCP), urine         Cutoff 25 ng/mL Cannabinoid, urine                 Cutoff 50 ng/mL Barbiturates + metabolites, urine  Cutoff 200 ng/mL Benzodiazepine, urine              Cutoff 200 ng/mL Methadone, urine                   Cutoff 300 ng/mL The urine drug screen  provides only a preliminary, unconfirmed analytical test result and should not be used for non-medical purposes.  Clinical consideration and professional judgment should be applied to any positive drug screen result due to possible interfering substances. A more specific alternate chemical method must be used in order to obtain a confirmed analytical result. Gas chromatography / mass spectrometry (GC/MS) is the preferred confirmat ory method. Performed at [facility], [address] [place], [place] [address]   SARS Coronavirus 2 [facility] order, Performed in [facility] lab) Nasopharyngeal Nasopharyngeal Swab     Status: None   Collection Time: 12/23/18  6:37 PM   Specimen: Nasopharyngeal Swab  Result Value Ref Range   SARS Coronavirus 2 NEGATIVE NEGATIVE    Comment: (NOTE) If result is NEGATIVE SARS-CoV-2 target nucleic acids are NOT DETECTED. The SARS-CoV-2 RNA is generally detectable in upper and lower  respiratory specimens during the acute phase of infection. The lowest  concentration of SARS-CoV-2 viral copies this assay can detect is 250  copies / mL. A negative result does not preclude SARS-CoV-2 infection  and should not be used as the sole basis for treatment or other  patient management decisions.  A negative result may occur with  improper specimen collection / handling, submission of specimen other  than nasopharyngeal swab, presence of viral mutation(s) within the  areas targeted by this assay, and inadequate number of viral copies  (<250 copies / mL). A negative result must be combined with clinical  observations, patient history, and epidemiological information. If result is POSITIVE SARS-CoV-2 target nucleic acids are DETECTED. The SARS-CoV-2 RNA is generally detectable in upper and lower  respiratory specimens dur ing the acute phase of infection.  Positive  results are indicative of active infection with SARS-CoV-2.  Clinical  correlation with patient history and other diagnostic information is  necessary to determine patient infection status.   Positive results do  not rule out bacterial infection or co-infection with other viruses. If result is PRESUMPTIVE POSTIVE SARS-CoV-2 nucleic acids MAY BE PRESENT.   A presumptive positive result was obtained on the submitted specimen  and confirmed on repeat testing.  While 2019 novel coronavirus  (SARS-CoV-2) nucleic acids may be present in the submitted sample  additional confirmatory testing may be necessary for epidemiological  and / or clinical management purposes  to differentiate between  SARS-CoV-2 and other Sarbecovirus currently known to infect humans.  If clinically indicated additional testing with an alternate test  methodology [phone]) is advised. The SARS-CoV-2 RNA is generally  detectable in upper and lower respiratory sp ecimens during the acute  phase of infection. The expected result is Negative. Fact Sheet for Patients:  BoilerBrush.com.cy Fact Sheet for Healthcare Providers: https://pope.com/ This test is not yet approved or cleared by the [place] FDA and has been authorized for detection and/or diagnosis of SARS-CoV-2 by FDA under an Emergency Use Authorization (EUA).  This EUA will remain in effect (meaning this test can be used) for the duration of the COVID-19 declaration under Section 564(b)(1) of the Act, 21 U.S.C. section 360bbb-3(b)(1), unless the authorization is terminated or revoked sooner. Performed at [facility], [address] [place], [place] [address]    Ct Abdomen Pelvis W Contrast  Result Date: 12/23/2018 CLINICAL DATA:  Paraumbilical abdominal pain and nausea and vomiting beginning this morning. EXAM: CT ABDOMEN AND PELVIS WITH CONTRAST TECHNIQUE: Multidetector CT imaging of the abdomen and pelvis was performed using the standard protocol following bolus administration of intravenous contrast.  CONTRAST:  OMNIPAQUE IOHEXOL 300 MG/ML  SOLN COMPARISON:  None. FINDINGS: Lower  Chest: No acute findings. Hepatobiliary: No hepatic masses identified. Gallstones are seen, however there is no evidence of cholecystitis or biliary dilatation. Pancreas:  No mass or inflammatory changes. Spleen: Within normal limits in size and appearance. Adrenals/Urinary Tract: No masses identified. Left renal parapelvic cyst noted. No evidence of ureteral calculi or hydronephrosis. Unremarkable unopacified urinary bladder. Stomach/Bowel: No evidence of obstruction, inflammatory process or abnormal fluid collections. Normal appendix visualized. Vascular/Lymphatic: No pathologically enlarged lymph nodes. No abdominal aortic aneurysm. Reproductive:  No mass or other significant abnormality. Other:  None. Musculoskeletal: No suspicious bone lesions identified. Advanced degenerative disc disease is noted at L4-5. IMPRESSION: 1. No acute findings. 2. Cholelithiasis. No radiographic evidence of cholecystitis. Electronically Signed   By: Danae Orleans M.D.   On: 12/23/2018 16:26   US Abdomen Limited Ruq  Result Date: 12/23/2018 CLINICAL DATA:  Patient with abdominal pain. EXAM: ULTRASOUND ABDOMEN LIMITED RIGHT UPPER QUADRANT COMPARISON:  None. FINDINGS: Gallbladder: Multiple stones within the gallbladder lumen. No gallbladder wall thickening or pericholecystic fluid. Common bile duct: Diameter: 7 mm Liver: No focal lesion identified. Within normal limits in parenchymal echogenicity. Portal vein is patent on color Doppler imaging with normal direction of blood flow towards the liver. Other: None. IMPRESSION: Cholelithiasis without secondary signs to suggest acute cholecystitis. Common bile duct is mildly dilated. Recommend correlation with LFTs. Electronically Signed   By: Annia Belt M.D.   On: 12/23/2018 18:06    Pending Labs [employer] (From admission, onward)    Start     Ordered   Signed and Held  HIV antibody (Routine Testing)  Once,   R     Signed and Held   Signed and Held  CBC  Tomorrow morning,    R     Signed and Held   Signed and Held  CBC  (enoxaparin (LOVENOX)    CrCl >/= 30 ml/min)  Once,   R    Comments: Baseline for enoxaparin therapy IF NOT ALREADY DRAWN.  Notify MD if PLT < 100 K.    Signed and Held   Signed and Held  Creatinine, serum  (enoxaparin (LOVENOX)    CrCl >/= 30 ml/min)  Once,   R    Comments: Baseline for enoxaparin therapy IF NOT ALREADY DRAWN.    Signed and Held   Signed and Held  Creatinine, serum  (enoxaparin (LOVENOX)    CrCl >/= 30 ml/min)  Weekly,   R    Comments: while on enoxaparin therapy    Signed and Held   Signed and Held  Comprehensive metabolic panel  Tomorrow morning,   R     Signed and Held   Signed and Held  Lipase, blood  Tomorrow morning,   R     Signed and Held          Vitals/Pain Today's Vitals   12/23/18 1836 12/23/18 1836 12/23/18 1930 12/23/18 2010  BP: (!) 111/49  (!) 139/102   Pulse: 81  87   Resp: 17  (!) 32   Temp:      TempSrc:      SpO2: 100%  100%   Weight:      Height:      PainSc:  4   Asleep    Isolation Precautions No active isolations  Medications Medications  sodium chloride flush (NS) 0.9 % injection 3 mL (3 mLs Intravenous Not Given 12/23/18 1334)  ondansetron (  ZOFRAN) injection 4 mg (4 mg Intravenous Given 12/23/18 1352)  sodium chloride 0.9 % bolus 1,000 mL (0 mLs Intravenous Stopped 12/23/18 1533)  promethazine (PHENERGAN) injection 12.5 mg (12.5 mg Intravenous Given 12/23/18 1442)  iohexol (OMNIPAQUE) 300 MG/ML solution 100 mL (100 mLs Intravenous Contrast Given 12/23/18 1535)  morphine 4 MG/ML injection 4 mg (4 mg Intravenous Given 12/23/18 1729)  promethazine (PHENERGAN) injection 12.5 mg (12.5 mg Intravenous Given 12/23/18 1727)    Mobility walks Low fall risk   Focused Assessments Intractable vomiting   R Recommendations: See Admitting Provider Note  Report given to:   Additional Notes:

## 2018-12-23 NOTE — ED Notes (Signed)
Patient transported to Ultrasound 

## 2018-12-23 NOTE — ED Notes (Signed)
Pt ambulatory to restroom

## 2018-12-23 NOTE — H&P (Signed)
[facility] [place] [facility]    PATIENT NAME: Casey MuskratRylanda Hatfield    MR#:  [id number]  DATE OF BIRTH:  25-Sep-1970  DATE OF ADMISSION:  12/23/2018  PRIMARY CARE PHYSICIAN: Marisue IvanLinthavong, [place], MD   REQUESTING/REFERRING PHYSICIAN: Dr. Paschal DoppSarah Monks  CHIEF COMPLAINT:   Chief Complaint  Patient presents with   Abdominal Pain   Emesis    HISTORY OF PRESENT ILLNESS:  [name]  is a 48 y.o. female with a known history of essential hypertension who presents to the hospital complaining of abdominal pain.  Patient describes abdominal pain as sharp in nature located in the epigastric/right upper quadrant area associated with multiple episodes of nausea and vomiting today.  She denies any fevers but admits to some chills, no diarrhea, melena or hematochezia.  She presents to the emergency room as her symptoms were not improving and underwent a CT scan of the abdomen pelvis which was negative for acute pathology, although abdominal ultrasound is suggestive of possible choledocholithiasis.  Patient's LFTs are normal and her bilirubins are normal.  She continues to have nausea vomiting and worsening abdominal pain despite being treated in the ER and therefore hospitalist services were contacted for admission.  PAST MEDICAL HISTORY:   Past Medical History:  Diagnosis Date   Hypertension     PAST SURGICAL HISTORY:   Past Surgical History:  Procedure Laterality Date   LAPAROSCOPY      SOCIAL HISTORY:   Social History   Tobacco Use   Smoking status: Never Smoker   Smokeless tobacco: Never Used  Substance Use Topics   Alcohol use: No    Frequency: Never    FAMILY HISTORY:   Family History  Problem Relation Age of Onset   Breast cancer Mother [id number]   Diabetes Father    Hypertension Father     DRUG ALLERGIES:   Allergies  Allergen Reactions   Penicillins Hives   Shellfish Allergy Anaphylaxis    REVIEW OF SYSTEMS:   Review of Systems    Constitutional: Negative for fever and weight loss.  HENT: Negative for congestion, nosebleeds and tinnitus.   Eyes: Negative for blurred vision, double vision and redness.  Respiratory: Negative for cough, hemoptysis and shortness of breath.   Cardiovascular: Negative for chest pain, orthopnea, leg swelling and PND.  Gastrointestinal: Positive for abdominal pain, nausea and vomiting. Negative for diarrhea and melena.  Genitourinary: Negative for dysuria, hematuria and urgency.  Musculoskeletal: Negative for falls and joint pain.  Neurological: Negative for dizziness, tingling, sensory change, focal weakness, seizures, weakness and headaches.  Endo/Heme/Allergies: Negative for polydipsia. Does not bruise/bleed easily.  Psychiatric/Behavioral: Negative for depression and memory loss. The patient is not nervous/anxious.     MEDICATIONS AT HOME:   Prior to Admission medications   Medication Sig Start Date End Date Taking? Authorizing Provider  Ferrous Sulfate (IRON) 325 (65 Fe) MG TABS Take 1 tablet by mouth daily.   Yes [provider]  hydrochlorothiazide (MICROZIDE) 12.5 MG capsule Take 12.5 mg by mouth daily.   Yes [provider]      VITAL SIGNS:  Blood pressure (!) 139/102, pulse 87, temperature 98.7 F (37.1 C), temperature source Oral, resp. rate (!) 32, height 5\' 6"  (1.676 m), weight 104.3 kg, SpO2 100 %.  PHYSICAL EXAMINATION:  Physical Exam  GENERAL:  48 y.o.-year-old patient lying in bed in mild GI distress.  EYES: Pupils equal, round, reactive to light and accommodation. No scleral icterus. Extraocular muscles intact.  HEENT: Head  atraumatic, normocephalic. Oropharynx and nasopharynx clear. No oropharyngeal erythema, moist oral mucosa  NECK:  Supple, no jugular venous distention. No thyroid enlargement, no tenderness.  LUNGS: Normal breath sounds bilaterally, no wheezing, rales, rhonchi. No use of accessory muscles of respiration.  CARDIOVASCULAR: S1,  S2 RRR. No murmurs, rubs, gallops, clicks.  ABDOMEN: Soft, Tender in RUQ, no rebound, rigidity, nondistended. Bowel sounds present. No organomegaly or mass.  EXTREMITIES: No pedal edema, cyanosis, or clubbing. + 2 pedal & radial pulses b/l.   NEUROLOGIC: Cranial nerves II through XII are intact. No focal Motor or sensory deficits appreciated b/l PSYCHIATRIC: The patient is alert and oriented x 3.  SKIN: No obvious rash, lesion, or ulcer.   LABORATORY PANEL:   CBC Recent Labs  Lab 12/23/18 1149  WBC 9.0  HGB 11.8*  HCT 36.9  PLT 381   ------------------------------------------------------------------------------------------------------------------  Chemistries  Recent Labs  Lab 12/23/18 1149  NA 135  K 3.3*  CL 98  CO2 26  GLUCOSE 132*  BUN 10  CREATININE 0.91  CALCIUM 9.5  AST 46*  ALT 49*  ALKPHOS 121  BILITOT 0.6   ------------------------------------------------------------------------------------------------------------------  Cardiac Enzymes No results for input(s): TROPONINI in the last 168 hours. ------------------------------------------------------------------------------------------------------------------  RADIOLOGY:  Ct Abdomen Pelvis W Contrast  Result Date: 12/23/2018 CLINICAL DATA:  Paraumbilical abdominal pain and nausea and vomiting beginning this morning. EXAM: CT ABDOMEN AND PELVIS WITH CONTRAST TECHNIQUE: Multidetector CT imaging of the abdomen and pelvis was performed using the standard protocol following bolus administration of intravenous contrast. CONTRAST:  OMNIPAQUE IOHEXOL 300 MG/ML  SOLN COMPARISON:  None. FINDINGS: Lower Chest: No acute findings. Hepatobiliary: No hepatic masses identified. Gallstones are seen, however there is no evidence of cholecystitis or biliary dilatation. Pancreas:  No mass or inflammatory changes. Spleen: Within normal limits in size and appearance. Adrenals/Urinary Tract: No masses identified. Left renal  parapelvic cyst noted. No evidence of ureteral calculi or hydronephrosis. Unremarkable unopacified urinary bladder. Stomach/Bowel: No evidence of obstruction, inflammatory process or abnormal fluid collections. Normal appendix visualized. Vascular/Lymphatic: No pathologically enlarged lymph nodes. No abdominal aortic aneurysm. Reproductive:  No mass or other significant abnormality. Other:  None. Musculoskeletal: No suspicious bone lesions identified. Advanced degenerative disc disease is noted at L4-5. IMPRESSION: 1. No acute findings. 2. Cholelithiasis. No radiographic evidence of cholecystitis. Electronically Signed   By: Danae Orleans M.D.   On: 12/23/2018 16:26   US Abdomen Limited Ruq  Result Date: 12/23/2018 CLINICAL DATA:  Patient with abdominal pain. EXAM: ULTRASOUND ABDOMEN LIMITED RIGHT UPPER QUADRANT COMPARISON:  None. FINDINGS: Gallbladder: Multiple stones within the gallbladder lumen. No gallbladder wall thickening or pericholecystic fluid. Common bile duct: Diameter: 7 mm Liver: No focal lesion identified. Within normal limits in parenchymal echogenicity. Portal vein is patent on color Doppler imaging with normal direction of blood flow towards the liver. Other: None. IMPRESSION: Cholelithiasis without secondary signs to suggest acute cholecystitis. Common bile duct is mildly dilated. Recommend correlation with LFTs. Electronically Signed   By: Annia Belt M.D.   On: 12/23/2018 18:06     IMPRESSION AND PLAN:   48 y.o. female with a known history of essential hypertension who presents to the hospital complaining of abdominal pain.  Patient describes abdominal pain as sharp in nature located in the epigastric/right upper quadrant area associated with multiple episodes of nausea and vomiting today.  1.  Intractable nausea and vomiting- etiology unclear but suspected to be secondary to biliary colic.  Patient's LFTs are stable and bilirubins are  normal.  CT abdomen pelvis showing no acute  cholecystitis but abdominal ultrasound is suggestive of possible cholelithiasis and choledocholithiasis. -We will treat supportively with IV fluids, antiemetics and pain control. -We will get MRCP. -Also get a gastroenterology consult and sent message to Dr. Allen Norris via Raeanne Gathers.   2.  Essential hypertension-hold patient's hydrochlorothiazide given the intractable nausea and vomiting. - Blood pressure currently stable.  3.  Hypokalemia-secondary to the intractable nausea vomiting.  Replace IV and check Mg. Level.   All the records are reviewed and case discussed with ED provider. Management plans discussed with the patient, family and they are in agreement.  CODE STATUS: Full code  TOTAL TIME TAKING CARE OF THIS PATIENT: 40 minutes.    Henreitta Leber M.D on 12/23/2018 at 8:04 PM  Between 7am to 6pm - Pager - [phone]  After 6pm go to [email or web address] - password [email or web address] [facility]  Office  [phone]  CC: Primary care physician; Dion Body, MD

## 2018-12-23 NOTE — ED Notes (Signed)
Pt vomiting in bathroom, MD notified.

## 2018-12-23 NOTE — ED Notes (Signed)
Pt to MRI at this time.

## 2018-12-23 NOTE — ED Notes (Signed)
Pt ambulated to toilet, NAD noted.  

## 2018-12-24 DIAGNOSIS — R1033 Periumbilical pain: Secondary | ICD-10-CM

## 2018-12-24 DIAGNOSIS — K8020 Calculus of gallbladder without cholecystitis without obstruction: Secondary | ICD-10-CM

## 2018-12-24 DIAGNOSIS — R112 Nausea with vomiting, unspecified: Secondary | ICD-10-CM

## 2018-12-24 DIAGNOSIS — R109 Unspecified abdominal pain: Secondary | ICD-10-CM

## 2018-12-24 LAB — CBC
HCT: 33.9 % — ABNORMAL LOW (ref 36.0–46.0)
Hemoglobin: 10.9 g/dL — ABNORMAL LOW (ref 12.0–15.0)
MCH: 27.4 pg (ref 26.0–34.0)
MCHC: 32.2 g/dL (ref 30.0–36.0)
MCV: 85.2 fL (ref 80.0–100.0)
Platelets: 352 10*3/uL (ref 150–400)
RBC: 3.98 MIL/uL (ref 3.87–5.11)
RDW: 13.7 % (ref 11.5–15.5)
WBC: 11.6 10*3/uL — ABNORMAL HIGH (ref 4.0–10.5)
nRBC: 0 % (ref 0.0–0.2)

## 2018-12-24 LAB — COMPREHENSIVE METABOLIC PANEL
ALT: 37 U/L (ref 0–44)
AST: 28 U/L (ref 15–41)
Albumin: 3.7 g/dL (ref 3.5–5.0)
Alkaline Phosphatase: 85 U/L (ref 38–126)
Anion gap: 10 (ref 5–15)
BUN: 8 mg/dL (ref 6–20)
CO2: 26 mmol/L (ref 22–32)
Calcium: 8.7 mg/dL — ABNORMAL LOW (ref 8.9–10.3)
Chloride: 100 mmol/L (ref 98–111)
Creatinine, Ser: 0.76 mg/dL (ref 0.44–1.00)
GFR calc Af Amer: >60 mL/min (ref >60)
GFR calc non Af Amer: >60 mL/min (ref >60)
Glucose, Bld: 125 mg/dL — ABNORMAL HIGH (ref 70–99)
Potassium: 3.4 mmol/L — ABNORMAL LOW (ref 3.5–5.1)
Sodium: 136 mmol/L (ref 135–145)
Total Bilirubin: 0.6 mg/dL (ref 0.3–1.2)
Total Protein: 7.3 g/dL (ref 6.5–8.1)

## 2018-12-24 LAB — LIPASE, BLOOD: Lipase: 36 U/L (ref 11–51)

## 2018-12-24 MED ORDER — HYDROCHLOROTHIAZIDE 12.5 MG PO CAPS
12.5000 mg | ORAL_CAPSULE | Freq: Every day | ORAL | Status: DC
  Administered 2018-12-24 – 2018-12-26 (×2): 12.5 mg via ORAL
  Filled 2018-12-24 (×2): qty 1

## 2018-12-24 MED ORDER — CIPROFLOXACIN IN D5W 400 MG/200ML IV SOLN
400.0000 mg | Freq: Two times a day (BID) | INTRAVENOUS | Status: DC
  Administered 2018-12-24 – 2018-12-26 (×5): 400 mg via INTRAVENOUS
  Filled 2018-12-24 (×7): qty 200

## 2018-12-24 MED ORDER — METRONIDAZOLE IN NACL 5-0.79 MG/ML-% IV SOLN
500.0000 mg | Freq: Three times a day (TID) | INTRAVENOUS | Status: DC
  Administered 2018-12-24 – 2018-12-26 (×6): 500 mg via INTRAVENOUS
  Filled 2018-12-24 (×9): qty 100

## 2018-12-24 NOTE — Progress Notes (Signed)
Patient ID: Casey Hatfield, female   DOB: [birth year], 48 y.o.   MRN: [id number]  HPI Casey Hatfield is a 48 y.o. female seen in consultation at the request of Dr. Allena KatzPatel.  She came in yesterday for intractable nausea vomiting abdominal pain.  She reports that her pain was located in the epigastric area and radiated to the right upper quadrant.  Intermittent nature moderate and without specific alleviating or aggravating factors.  The pain now is somewhat better.  She has been taking clears but her appetite has decreased.  No fevers no chills no evidence of obstructive jaundice.  She did have an ultrasound, MRCP and CT scan that I have personally reviewed no evidence of cholelithiasis.  No evidence of choledocholithiasis.  White count is slightly elevated at 11.5 hemoglobin is 10.9 and platelets are normal.  CMP is completely normal.  There was some transient AST and ALT elevation that has normalized now.  She is able to perform more than 4 METS of activity without any shortness of breath or chest pain. Of note she said she had a similar episode 10 years ago that subsided on its own  HPI  Past Medical History:  Diagnosis Date  . Hypertension     Past Surgical History:  Procedure Laterality Date  . LAPAROSCOPY      Family History  Problem Relation Age of Onset  . Breast cancer Mother 4455  . Diabetes Father   . Hypertension Father     Social History Social History   Tobacco Use  . Smoking status: Never Smoker  . Smokeless tobacco: Never Used  Substance Use Topics  . Alcohol use: No    Frequency: Never  . Drug use: No    Allergies  Allergen Reactions  . Penicillins Hives  . Shellfish Allergy Anaphylaxis    Current Facility-Administered Medications  Medication Dose Route Frequency Provider Last Rate Last Dose  . 0.9 % NaCl with KCl 20 mEq/ L  infusion   Intravenous Continuous [place] SirenSainani, Vivek J, MD 100 mL/hr at 12/24/18 0921    . acetaminophen (TYLENOL) tablet 650 mg  650 mg  Oral Q6H PRN [place] SirenSainani, Vivek J, MD       Or  . acetaminophen (TYLENOL) suppository 650 mg  650 mg Rectal Q6H PRN [place] SirenSainani, Vivek J, MD      . ciprofloxacin (CIPRO) IVPB 400 mg  400 mg Intravenous Q12H Cashlynn Yearwood F, MD      . enoxaparin (LOVENOX) injection 40 mg  40 mg Subcutaneous Q24H [place] SirenSainani, Vivek J, MD   40 mg at 12/23/18 2206  . influenza vac split quadrivalent PF (FLUARIX) injection 0.5 mL  0.5 mL Intramuscular Tomorrow-1000 Sainani, Rolly PancakeVivek J, MD      . metroNIDAZOLE (FLAGYL) IVPB 500 mg  500 mg Intravenous Q8H Zahari Xiang F, MD      . morphine 2 MG/ML injection 2 mg  2 mg Intravenous Q4H PRN [place] SirenSainani, Vivek J, MD   2 mg at 12/24/18 0608  . ondansetron (ZOFRAN) tablet 4 mg  4 mg Oral Q6H PRN [place] SirenSainani, Vivek J, MD       Or  . ondansetron [facility](ZOFRAN) injection 4 mg  4 mg Intravenous Q6H PRN [place] SirenSainani, Vivek J, MD   4 mg at 12/24/18 0608  . sodium chloride flush (NS) 0.9 % injection 3 mL  3 mL Intravenous Once Minna AntisPaduchowski, Kevin, MD         Review of Systems Full ROS  was asked and was negative except for  the information on the HPI  Physical Exam Blood pressure (!) 147/80, pulse 79, temperature 98.3 F (36.8 C), resp. rate 17, height 5\' 6"  (1.676 m), weight 110.7 kg, SpO2 100 %. CONSTITUTIONAL: NAD EYES: Pupils are equal, round, and reactive to light, Sclera are non-icteric. EARS, NOSE, MOUTH AND THROAT: The oropharynx is clear. The oral mucosa is pink and moist. Hearing is intact to voice. LYMPH NODES:  Lymph nodes in the neck are normal. RESPIRATORY:  Lungs are clear. There is normal respiratory effort, with equal breath sounds bilaterally, and without pathologic use of accessory muscles. CARDIOVASCULAR: Heart is regular without murmurs, gallops, or rubs. GI: The abdomen is soft, tender in the right upper quadrant with early Murphy signs but without  peritonitis. There are no palpable masses. There is no hepatosplenomegaly. There are normal bowel sounds in all quadrants. GU: Rectal  deferred.   MUSCULOSKELETAL: Normal muscle strength and tone. No cyanosis or edema.   SKIN: Turgor is good and there are no pathologic skin lesions or ulcers. NEUROLOGIC: Motor and sensation is grossly normal. Cranial nerves are grossly intact. PSYCH:  Oriented to person, place and time. Affect is normal.  Data Reviewed  I have personally reviewed the patient's imaging, laboratory findings and medical records.    Assessment/Plan 48 year old female with right upper quadrant epigastric pain consistent with early acute cholecystitis.  Please note that this is a recurrent episode.  Discussed with the patient in detail about her disease process.  I do think that she will benefit from cholecystectomy during this hospitalization. I discussed the procedure in detail.  The patient was given Neurosurgeon.  We discussed the risks and benefits of a laparoscopic cholecystectomy and possible cholangiogram including, but not limited to bleeding, infection, injury to surrounding structures such as the intestine or liver, bile leak, retained gallstones, need to convert to an open procedure, prolonged diarrhea, blood clots such as  DVT, common bile duct injury, anesthesia risks, and possible need for additional procedures.  The likelihood of improvement in symptoms and return to the patient's normal status is good. We discussed the typical post-operative recovery course. Given the persistent pain this is likely early acute cholecystitis and I will start antibiotic therapy.  We will place her n.p.o. and at her fourth scheduled tomorrow.  Discussed with Dr. Posey Pronto in detail   Caroleen Hamman, MD [place] Surgeon 12/24/2018, 11:16 AM

## 2018-12-24 NOTE — Progress Notes (Signed)
[facility] [place] [facility]   PATIENT NAME: Casey Hatfield    MR#:  [id number]  DATE OF BIRTH:  March 23, 1971  SUBJECTIVE:   Patient came in with intractable abdominal pain and nausea vomiting. Now feeling better. No vomiting. Abdominal pain improved. REVIEW OF SYSTEMS:   Review of Systems  Constitutional: Negative for chills, fever and weight loss.  HENT: Negative for ear discharge, ear pain and nosebleeds.   Eyes: Negative for blurred vision, pain and discharge.  Respiratory: Negative for sputum production, shortness of breath, wheezing and stridor.   Cardiovascular: Negative for chest pain, palpitations, orthopnea and PND.  Gastrointestinal: Negative for abdominal pain, diarrhea, nausea and vomiting.  Genitourinary: Negative for frequency and urgency.  Musculoskeletal: Negative for back pain and joint pain.  Neurological: Positive for weakness. Negative for sensory change, speech change and focal weakness.  Psychiatric/Behavioral: Negative for depression and hallucinations. The patient is not nervous/anxious.    Tolerating Diet:npo Tolerating PT: not needed  DRUG ALLERGIES:   Allergies  Allergen Reactions  . Penicillins Hives  . Shellfish Allergy Anaphylaxis    VITALS:  Blood pressure (!) 147/80, pulse 79, temperature 98.3 F (36.8 C), resp. rate 17, height 5\' 6"  (1.676 m), weight 110.7 kg, SpO2 100 %.  PHYSICAL EXAMINATION:   Physical Exam  GENERAL:  48 y.o.-year-old patient lying in the bed with no acute distress. obese EYES: Pupils equal, round, reactive to light and accommodation. No scleral icterus. Extraocular muscles intact.  HEENT: Head atraumatic, normocephalic. Oropharynx and nasopharynx clear.  NECK:  Supple, no jugular venous distention. No thyroid enlargement, no tenderness.  LUNGS: Normal breath sounds bilaterally, no wheezing, rales, rhonchi. No use of accessory muscles of respiration.  CARDIOVASCULAR: S1, S2 normal. No  murmurs, rubs, or gallops.  ABDOMEN: Soft, nontender, nondistended. Bowel sounds present. No organomegaly or mass.  EXTREMITIES: No cyanosis, clubbing or edema b/l.    NEUROLOGIC: Cranial nerves II through XII are intact. No focal Motor or sensory deficits b/l.   PSYCHIATRIC:  patient is alert and oriented x 3.  SKIN: No obvious rash, lesion, or ulcer.   LABORATORY PANEL:  CBC Recent Labs  Lab 12/24/18 0615  WBC 11.6*  HGB 10.9*  HCT 33.9*  PLT 352    Chemistries  Recent Labs  Lab 12/23/18 1149 12/24/18 0615  NA 135 136  K 3.3* 3.4*  CL 98 100  CO2 26 26  GLUCOSE 132* 125*  BUN 10 8  CREATININE 0.91 0.76  CALCIUM 9.5 8.7*  MG 1.9  --   AST 46* 28  ALT 49* 37  ALKPHOS 121 85  BILITOT 0.6 0.6   Cardiac Enzymes No results for input(s): TROPONINI in the last 168 hours. RADIOLOGY:  Ct Abdomen Pelvis W Contrast  Result Date: 12/23/2018 CLINICAL DATA:  Paraumbilical abdominal pain and nausea and vomiting beginning this morning. EXAM: CT ABDOMEN AND PELVIS WITH CONTRAST TECHNIQUE: Multidetector CT imaging of the abdomen and pelvis was performed using the standard protocol following bolus administration of intravenous contrast. CONTRAST:  OMNIPAQUE IOHEXOL 300 MG/ML  SOLN COMPARISON:  None. FINDINGS: Lower Chest: No acute findings. Hepatobiliary: No hepatic masses identified. Gallstones are seen, however there is no evidence of cholecystitis or biliary dilatation. Pancreas:  No mass or inflammatory changes. Spleen: Within normal limits in size and appearance. Adrenals/Urinary Tract: No masses identified. Left renal parapelvic cyst noted. No evidence of ureteral calculi or hydronephrosis. Unremarkable unopacified urinary bladder. Stomach/Bowel: No evidence of obstruction, inflammatory process or abnormal  fluid collections. Normal appendix visualized. Vascular/Lymphatic: No pathologically enlarged lymph nodes. No abdominal aortic aneurysm. Reproductive:  No mass or other  significant abnormality. Other:  None. Musculoskeletal: No suspicious bone lesions identified. Advanced degenerative disc disease is noted at L4-5. IMPRESSION: 1. No acute findings. 2. Cholelithiasis. No radiographic evidence of cholecystitis. Electronically Signed   By: Marlaine Hind M.D.   On: 12/23/2018 16:26   Mr 3d Recon At Scanner  Result Date: 12/23/2018 CLINICAL DATA:  Patient with right upper quadrant abdominal pain. Evaluate for choledocholithiasis. EXAM: MRI ABDOMEN WITHOUT AND WITH CONTRAST (INCLUDING MRCP) TECHNIQUE: Multiplanar multisequence MR imaging of the abdomen was performed both before and after the administration of intravenous contrast. Heavily T2-weighted images of the biliary and pancreatic ducts were obtained, and three-dimensional MRCP images were rendered by post processing. CONTRAST:  55mL GADAVIST GADOBUTROL 1 MMOL/ML IV SOLN COMPARISON:  CT abdomen pelvis and ultrasound right upper quadrant earlier same day. FINDINGS: Lower chest: Unremarkable Hepatobiliary: Liver is normal in size and contour. No focal hepatic lesion is identified. Multiple stones within the gallbladder lumen. No gallbladder wall thickening or pericholecystic fluid. Common bile duct measures 5 mm and tapers appropriately at the level of the ampulla. No choledocholithiasis. Pancreas:  Unremarkable Spleen:  Unremarkable Adrenals/Urinary Tract: Normal adrenal glands. 4.1 cm cyst within the midpole of the left kidney. Kidneys enhance symmetrically with contrast. No suspicious enhancing renal lesion. Stomach/Bowel: Normal morphology of the stomach. No abnormal bowel wall thickening. Vascular/Lymphatic: Normal caliber abdominal aorta. No retroperitoneal lymphadenopathy. Other:  None. Musculoskeletal: No aggressive or acute appearing osseous lesions. IMPRESSION: Cholelithiasis.  No secondary signs to suggest acute cholecystitis. No choledocholithiasis identified on current exam. Electronically Signed   By: Lovey Newcomer  M.D.   On: 12/23/2018 21:42   Mr Abdomen Mrcp Moise Boring Contast  Result Date: 12/23/2018 CLINICAL DATA:  Patient with right upper quadrant abdominal pain. Evaluate for choledocholithiasis. EXAM: MRI ABDOMEN WITHOUT AND WITH CONTRAST (INCLUDING MRCP) TECHNIQUE: Multiplanar multisequence MR imaging of the abdomen was performed both before and after the administration of intravenous contrast. Heavily T2-weighted images of the biliary and pancreatic ducts were obtained, and three-dimensional MRCP images were rendered by post processing. CONTRAST:  49mL GADAVIST GADOBUTROL 1 MMOL/ML IV SOLN COMPARISON:  CT abdomen pelvis and ultrasound right upper quadrant earlier same day. FINDINGS: Lower chest: Unremarkable Hepatobiliary: Liver is normal in size and contour. No focal hepatic lesion is identified. Multiple stones within the gallbladder lumen. No gallbladder wall thickening or pericholecystic fluid. Common bile duct measures 5 mm and tapers appropriately at the level of the ampulla. No choledocholithiasis. Pancreas:  Unremarkable Spleen:  Unremarkable Adrenals/Urinary Tract: Normal adrenal glands. 4.1 cm cyst within the midpole of the left kidney. Kidneys enhance symmetrically with contrast. No suspicious enhancing renal lesion. Stomach/Bowel: Normal morphology of the stomach. No abnormal bowel wall thickening. Vascular/Lymphatic: Normal caliber abdominal aorta. No retroperitoneal lymphadenopathy. Other:  None. Musculoskeletal: No aggressive or acute appearing osseous lesions. IMPRESSION: Cholelithiasis.  No secondary signs to suggest acute cholecystitis. No choledocholithiasis identified on current exam. Electronically Signed   By: Lovey Newcomer M.D.   On: 12/23/2018 21:42   US Abdomen Limited Ruq  Result Date: 12/23/2018 CLINICAL DATA:  Patient with abdominal pain. EXAM: ULTRASOUND ABDOMEN LIMITED RIGHT UPPER QUADRANT COMPARISON:  None. FINDINGS: Gallbladder: Multiple stones within the gallbladder lumen. No  gallbladder wall thickening or pericholecystic fluid. Common bile duct: Diameter: 7 mm Liver: No focal lesion identified. Within normal limits in parenchymal echogenicity. Portal vein is patent on  color Doppler imaging with normal direction of blood flow towards the liver. Other: None. IMPRESSION: Cholelithiasis without secondary signs to suggest acute cholecystitis. Common bile duct is mildly dilated. Recommend correlation with LFTs. Electronically Signed   By: Annia Beltrew  Davis M.D.   On: 12/23/2018 18:06   ASSESSMENT AND PLAN:  48 y.o. female with a known history of essential hypertension who presents to the hospital complaining of abdominal pain.  Patient describes abdominal pain as sharp in nature located in the epigastric/right upper quadrant area associated with multiple episodes of nausea and vomiting today.  1.  Intractable nausea and vomiting-  suspected to be secondary to biliary colic.  Patient's LFTs are stable and bilirubins are normal.   -CT abdomen pelvis showing no acute cholecystitis but abdominal ultrasound is suggestive of possible cholelithiasis and choledocholithiasis. -MRCP shows Gallstones.  -Seen by GI dr Lars MassonWohl--no indication for any procedures - supportively with IV fluids, antiemetics and pain control. -Surgical consultation with Dr. Valaria GoodPabone  2.  Essential hypertension-hold patient's hydrochlorothiazide given the intractable nausea and vomiting. - Blood pressure currently stable.  3.  Hypokalemia-secondary to the intractable nausea vomiting.  Replace IV  -Mg. Level 1.9  4. DVT prophylaxis-- lovenox  once seen by surgery and if no surgical plans patient tolerated PO diet will discharge her to home with outpatient follow-up  Management plans discussed with the patient and they are in agreement.  CODE STATUS: full  DVT Prophylaxis: lovnoex  TOTAL TIME TAKING CARE OF THIS PATIENT: *30* minutes.  >50% time spent on counselling and coordination of care  POSSIBLE D/C IN  *1* DAYS, DEPENDING ON CLINICAL CONDITION.  Note: This dictation was prepared with Dragon dictation along with smaller phrase technology. Any transcriptional errors that result from this process are unintentional.  Enedina FinnerSona Acel Natzke M.D on 12/24/2018 at 9:44 AM  Between 7am to 6pm - Pager - [phone]  After 6pm go to [email or web address] - password [email or web address]  [facility] [place] [facility]  Office  [phone]  CC: Primary care physician; Marisue IvanLinthavong, Kanhka, MDPatient ID: Casey Hatfield, female   DOB: 01-Mar-1971, 48 y.o.   MRN: [id number]

## 2018-12-24 NOTE — Consult Note (Signed)
Lucilla Lame, MD [facility]  [address] [place], [place] [address] Phone: [phone] Fax : [phone]  Consultation  Referring Provider:     Dr. Verdell Carmine Primary Care Physician:  Dion Body, MD Primary Gastroenterologist: Althia Forts         Reason for Consultation:     Nausea vomiting and abdominal pain  Date of Admission:  12/23/2018 Date of Consultation:  12/24/2018         HPI:   Casey Hatfield is a 48 y.o. female who was admitted with nausea vomiting and abdominal pain.  The patient states her symptoms started yesterday and her abdominal pain was in the mid abdominal region.  She reports that she vomited approximately 6 times yesterday.  She has had no further abdominal pain today or any nausea vomiting.  The patient did have an ultrasound that showed:  IMPRESSION: Cholelithiasis without secondary signs to suggest acute cholecystitis. Common bile duct is mildly dilated. Recommend correlation with LFTs.  The patient then underwent an MRI with an MRCP that showed:  IMPRESSION: Cholelithiasis.  No secondary signs to suggest acute cholecystitis. No choledocholithiasis identified on current exam.  The patient's liver enzymes have been normal.  She denies any previous episodes similar to this.  No report of any unexplained weight loss fevers or chills.  She also reports that food did not seem to make the pain any better or worse.  Past Medical History:  Diagnosis Date  . Hypertension     Past Surgical History:  Procedure Laterality Date  . LAPAROSCOPY      Prior to Admission medications   Medication Sig Start Date End Date Taking? Authorizing Provider  Ferrous Sulfate (IRON) 325 (65 Fe) MG TABS Take 1 tablet by mouth daily.   Yes [provider]  hydrochlorothiazide (MICROZIDE) 12.5 MG capsule Take 12.5 mg by mouth daily.   Yes [provider]    Family History  Problem Relation Age of Onset  . Breast cancer Mother 34  .  Diabetes Father   . Hypertension Father      Social History   Tobacco Use  . Smoking status: Never Smoker  . Smokeless tobacco: Never Used  Substance Use Topics  . Alcohol use: No    Frequency: Never  . Drug use: No    Allergies as of 12/23/2018 - Review Complete 12/23/2018  Allergen Reaction Noted  . Penicillins Hives 04/09/2017  . Shellfish allergy Anaphylaxis 04/09/2017    Review of Systems:    All systems reviewed and negative except where noted in HPI.   Physical Exam:  Vital signs in last 24 hours: Temp:  [98.3 F (36.8 C)-99.1 F (37.3 C)] 98.3 F (36.8 C) (09/12 0721) Pulse Rate:  [79-87] 79 (09/12 0721) Resp:  [16-32] 17 (09/12 0721) BP: (111-165)/(49-102) 147/80 (09/12 0721) SpO2:  [93 %-100 %] 100 % (09/12 0721) Weight:  [104.3 kg-110.7 kg] 110.7 kg (09/11 2132)   General:   Pleasant, cooperative in NAD Head:  Normocephalic and atraumatic. Eyes:   No icterus.   Conjunctiva pink. PERRLA. Ears:  Normal auditory acuity. Neck:  Supple; no masses or thyroidomegaly Lungs: Respirations even and unlabored. Lungs clear to auscultation bilaterally.   No wheezes, crackles, or rhonchi.  Heart:  Regular rate and rhythm;  Without murmur, clicks, rubs or gallops Abdomen:  Soft, nondistended, nontender. Normal bowel sounds. No appreciable masses or hepatomegaly.  No rebound or guarding.  Rectal:  Not performed. Msk:  Symmetrical without gross deformities.  Extremities:  Without edema, cyanosis or clubbing. Neurologic:  Alert and oriented x3;  grossly normal neurologically. Skin:  Intact without significant lesions or rashes. Cervical Nodes:  No significant cervical adenopathy. Psych:  Alert and cooperative. Normal affect.  LAB RESULTS: Recent Labs    12/23/18 1149 12/24/18 0615  WBC 9.0 11.6*  HGB 11.8* 10.9*  HCT 36.9 33.9*  PLT 381 352   BMET Recent Labs    12/23/18 1149 12/24/18 0615  NA 135 136  K 3.3* 3.4*  CL 98 100  CO2 26 26  GLUCOSE 132*  125*  BUN 10 8  CREATININE 0.91 0.76  CALCIUM 9.5 8.7*   LFT Recent Labs    12/24/18 0615  PROT 7.3  ALBUMIN 3.7  AST 28  ALT 37  ALKPHOS 85  BILITOT 0.6   PT/INR No results for input(s): LABPROT, INR in the last 72 hours.  STUDIES: Ct Abdomen Pelvis W Contrast  Result Date: 12/23/2018 CLINICAL DATA:  Paraumbilical abdominal pain and nausea and vomiting beginning this morning. EXAM: CT ABDOMEN AND PELVIS WITH CONTRAST TECHNIQUE: Multidetector CT imaging of the abdomen and pelvis was performed using the standard protocol following bolus administration of intravenous contrast. CONTRAST:  100mL OMNIPAQUE IOHEXOL 300 MG/ML  SOLN COMPARISON:  None. FINDINGS: Lower Chest: No acute findings. Hepatobiliary: No hepatic masses identified. Gallstones are seen, however there is no evidence of cholecystitis or biliary dilatation. Pancreas:  No mass or inflammatory changes. Spleen: Within normal limits in size and appearance. Adrenals/Urinary Tract: No masses identified. Left renal parapelvic cyst noted. No evidence of ureteral calculi or hydronephrosis. Unremarkable unopacified urinary bladder. Stomach/Bowel: No evidence of obstruction, inflammatory process or abnormal fluid collections. Normal appendix visualized. Vascular/Lymphatic: No pathologically enlarged lymph nodes. No abdominal aortic aneurysm. Reproductive:  No mass or other significant abnormality. Other:  None. Musculoskeletal: No suspicious bone lesions identified. Advanced degenerative disc disease is noted at L4-5. IMPRESSION: 1. No acute findings. 2. Cholelithiasis. No radiographic evidence of cholecystitis. Electronically Signed   By: Danae OrleansJohn A Stahl M.D.   On: 12/23/2018 16:26   Mr 3d Recon At Scanner  Result Date: 12/23/2018 CLINICAL DATA:  Patient with right upper quadrant abdominal pain. Evaluate for choledocholithiasis. EXAM: MRI ABDOMEN WITHOUT AND WITH CONTRAST (INCLUDING MRCP) TECHNIQUE: Multiplanar multisequence MR imaging of the  abdomen was performed both before and after the administration of intravenous contrast. Heavily T2-weighted images of the biliary and pancreatic ducts were obtained, and three-dimensional MRCP images were rendered by post processing. CONTRAST:  10mL GADAVIST GADOBUTROL 1 MMOL/ML IV SOLN COMPARISON:  CT abdomen pelvis and ultrasound right upper quadrant earlier same day. FINDINGS: Lower chest: Unremarkable Hepatobiliary: Liver is normal in size and contour. No focal hepatic lesion is identified. Multiple stones within the gallbladder lumen. No gallbladder wall thickening or pericholecystic fluid. Common bile duct measures 5 mm and tapers appropriately at the level of the ampulla. No choledocholithiasis. Pancreas:  Unremarkable Spleen:  Unremarkable Adrenals/Urinary Tract: Normal adrenal glands. 4.1 cm cyst within the midpole of the left kidney. Kidneys enhance symmetrically with contrast. No suspicious enhancing renal lesion. Stomach/Bowel: Normal morphology of the stomach. No abnormal bowel wall thickening. Vascular/Lymphatic: Normal caliber abdominal aorta. No retroperitoneal lymphadenopathy. Other:  None. Musculoskeletal: No aggressive or acute appearing osseous lesions. IMPRESSION: Cholelithiasis.  No secondary signs to suggest acute cholecystitis. No choledocholithiasis identified on current exam. Electronically Signed   By: Annia Beltrew  Davis M.D.   On: 12/23/2018 21:42   Mr Abdomen Mrcp Vivien RossettiW Wo Contast  Result Date: 12/23/2018 CLINICAL  DATA:  Patient with right upper quadrant abdominal pain. Evaluate for choledocholithiasis. EXAM: MRI ABDOMEN WITHOUT AND WITH CONTRAST (INCLUDING MRCP) TECHNIQUE: Multiplanar multisequence MR imaging of the abdomen was performed both before and after the administration of intravenous contrast. Heavily T2-weighted images of the biliary and pancreatic ducts were obtained, and three-dimensional MRCP images were rendered by post processing. CONTRAST:  88mL GADAVIST GADOBUTROL 1 MMOL/ML IV  SOLN COMPARISON:  CT abdomen pelvis and ultrasound right upper quadrant earlier same day. FINDINGS: Lower chest: Unremarkable Hepatobiliary: Liver is normal in size and contour. No focal hepatic lesion is identified. Multiple stones within the gallbladder lumen. No gallbladder wall thickening or pericholecystic fluid. Common bile duct measures 5 mm and tapers appropriately at the level of the ampulla. No choledocholithiasis. Pancreas:  Unremarkable Spleen:  Unremarkable Adrenals/Urinary Tract: Normal adrenal glands. 4.1 cm cyst within the midpole of the left kidney. Kidneys enhance symmetrically with contrast. No suspicious enhancing renal lesion. Stomach/Bowel: Normal morphology of the stomach. No abnormal bowel wall thickening. Vascular/Lymphatic: Normal caliber abdominal aorta. No retroperitoneal lymphadenopathy. Other:  None. Musculoskeletal: No aggressive or acute appearing osseous lesions. IMPRESSION: Cholelithiasis.  No secondary signs to suggest acute cholecystitis. No choledocholithiasis identified on current exam. Electronically Signed   By: Annia Belt M.D.   On: 12/23/2018 21:42   US Abdomen Limited Ruq  Result Date: 12/23/2018 CLINICAL DATA:  Patient with abdominal pain. EXAM: ULTRASOUND ABDOMEN LIMITED RIGHT UPPER QUADRANT COMPARISON:  None. FINDINGS: Gallbladder: Multiple stones within the gallbladder lumen. No gallbladder wall thickening or pericholecystic fluid. Common bile duct: Diameter: 7 mm Liver: No focal lesion identified. Within normal limits in parenchymal echogenicity. Portal vein is patent on color Doppler imaging with normal direction of blood flow towards the liver. Other: None. IMPRESSION: Cholelithiasis without secondary signs to suggest acute cholecystitis. Common bile duct is mildly dilated. Recommend correlation with LFTs. Electronically Signed   By: Annia Belt M.D.   On: 12/23/2018 18:06      Impression / Plan:   Assessment: Active Problems:   Intractable nausea and  vomiting   Casey Hatfield is a 48 y.o. y/o female with nausea vomiting and abdominal pain.  The patient symptoms have completely resolved.  The patient also has not had a change in bowel habits.  Plan:  The patient differential diagnosis for her abdominal pain is musculoskeletal versus an acute viral illness versus a past CBD stone.  The patient will have her diet advanced and there will be no need for any endoscopic intervention at this time.  The patient has not had a colonoscopy and is 48 years old and has been given my card to set up a screening colonoscopy as an outpatient.  The patient has been explained the plan and agrees with it.  Thank you for involving me in the care of this patient.      LOS: 0 days   Midge Minium, MD  12/24/2018, 9:41 AM Pager [phone] 7am-5pm  Check AMION for 5pm -7am coverage   Note: This dictation was prepared with Dragon dictation along with smaller phrase technology. Any transcriptional errors that result from this process are unintentional.

## 2018-12-24 NOTE — Progress Notes (Signed)
Patient ID: Casey Hatfield, female   DOB: [birth year], 48 y.o.   MRN: [id number] Dr. Adora Fridge from surgery plans to do lab [facility] tomorrow. Patient is medically otherwise stable. I will resume her hydrochlorothiazide for blood pressure. From medical standpoint she is stable. I will transfer the service with Dr. Adora Fridge. He is agreeable with it. Call us if needed. Thank you

## 2018-12-25 ENCOUNTER — Observation Stay: Admitting: Anesthesiology

## 2018-12-25 ENCOUNTER — Encounter: Payer: Self-pay | Admitting: Surgery

## 2018-12-25 ENCOUNTER — Encounter
Admission: EM | Disposition: A | Discharge status: Home / Self Care | Payer: Self-pay | Source: Home / Self Care | Attending: Surgery

## 2018-12-25 DIAGNOSIS — K8309 Other cholangitis: Secondary | ICD-10-CM | POA: Diagnosis present

## 2018-12-25 DIAGNOSIS — Z91013 Allergy to seafood: Secondary | ICD-10-CM | POA: Diagnosis not present

## 2018-12-25 DIAGNOSIS — Z20828 Contact with and (suspected) exposure to other viral communicable diseases: Secondary | ICD-10-CM | POA: Diagnosis present

## 2018-12-25 DIAGNOSIS — Z803 Family history of malignant neoplasm of breast: Secondary | ICD-10-CM | POA: Diagnosis not present

## 2018-12-25 DIAGNOSIS — K82A1 Gangrene of gallbladder in cholecystitis: Secondary | ICD-10-CM | POA: Diagnosis present

## 2018-12-25 DIAGNOSIS — I10 Essential (primary) hypertension: Secondary | ICD-10-CM | POA: Diagnosis present

## 2018-12-25 DIAGNOSIS — Z8249 Family history of ischemic heart disease and other diseases of the circulatory system: Secondary | ICD-10-CM | POA: Diagnosis not present

## 2018-12-25 DIAGNOSIS — K8000 Calculus of gallbladder with acute cholecystitis without obstruction: Secondary | ICD-10-CM | POA: Diagnosis present

## 2018-12-25 DIAGNOSIS — Z791 Long term (current) use of non-steroidal anti-inflammatories (NSAID): Secondary | ICD-10-CM | POA: Diagnosis not present

## 2018-12-25 DIAGNOSIS — Z833 Family history of diabetes mellitus: Secondary | ICD-10-CM | POA: Diagnosis not present

## 2018-12-25 DIAGNOSIS — E876 Hypokalemia: Secondary | ICD-10-CM | POA: Diagnosis present

## 2018-12-25 DIAGNOSIS — K819 Cholecystitis, unspecified: Secondary | ICD-10-CM | POA: Diagnosis present

## 2018-12-25 DIAGNOSIS — K660 Peritoneal adhesions (postprocedural) (postinfection): Secondary | ICD-10-CM | POA: Diagnosis present

## 2018-12-25 DIAGNOSIS — Z23 Encounter for immunization: Secondary | ICD-10-CM | POA: Diagnosis not present

## 2018-12-25 DIAGNOSIS — K8020 Calculus of gallbladder without cholecystitis without obstruction: Secondary | ICD-10-CM | POA: Diagnosis not present

## 2018-12-25 DIAGNOSIS — Z880 Allergy status to penicillin: Secondary | ICD-10-CM | POA: Diagnosis not present

## 2018-12-25 HISTORY — PX: CHOLECYSTECTOMY: SHX55

## 2018-12-25 SURGERY — LAPAROSCOPIC CHOLECYSTECTOMY
Anesthesia: General

## 2018-12-25 MED ORDER — HYDROMORPHONE HCL 1 MG/ML IJ SOLN
0.2500 mg | INTRAMUSCULAR | Status: DC | PRN
  Administered 2018-12-25 (×2): 0.5 mg via INTRAVENOUS

## 2018-12-25 MED ORDER — KETOROLAC TROMETHAMINE 30 MG/ML IJ SOLN
30.0000 mg | Freq: Once | INTRAMUSCULAR | Status: AC | PRN
  Administered 2018-12-25: 30 mg via INTRAVENOUS

## 2018-12-25 MED ORDER — HYDROCODONE-ACETAMINOPHEN 7.5-325 MG PO TABS: 1.0000 | ORAL_TABLET | Freq: Once | ORAL | Status: DC | PRN

## 2018-12-25 MED ORDER — POTASSIUM CHLORIDE IN NACL 20-0.9 MEQ/L-% IV SOLN
INTRAVENOUS | Status: DC
  Administered 2018-12-25: 11:00:00 via INTRAVENOUS
  Filled 2018-12-25 (×4): qty 1000

## 2018-12-25 MED ORDER — LIDOCAINE HCL (CARDIAC) PF 100 MG/5ML IV SOSY
PREFILLED_SYRINGE | INTRAVENOUS | Status: DC | PRN
  Administered 2018-12-25: 80 mg via INTRAVENOUS

## 2018-12-25 MED ORDER — ACETAMINOPHEN 10 MG/ML IV SOLN
INTRAVENOUS | Status: DC | PRN
  Administered 2018-12-25: 1000 mg via INTRAVENOUS

## 2018-12-25 MED ORDER — FENTANYL CITRATE (PF) 100 MCG/2ML IJ SOLN
INTRAMUSCULAR | Status: DC | PRN
  Administered 2018-12-25 (×4): 50 ug via INTRAVENOUS

## 2018-12-25 MED ORDER — KETOROLAC TROMETHAMINE 30 MG/ML IJ SOLN
INTRAMUSCULAR | Status: AC
  Administered 2018-12-25: 11:00:00
  Filled 2018-12-25: qty 1

## 2018-12-25 MED ORDER — SUCCINYLCHOLINE CHLORIDE 20 MG/ML IJ SOLN
INTRAMUSCULAR | Status: AC
  Filled 2018-12-25: qty 1

## 2018-12-25 MED ORDER — ROCURONIUM BROMIDE 50 MG/5ML IV SOLN
INTRAVENOUS | Status: AC
  Filled 2018-12-25: qty 1

## 2018-12-25 MED ORDER — PROPOFOL 10 MG/ML IV BOLUS
INTRAVENOUS | Status: DC | PRN
  Administered 2018-12-25: 120 mg via INTRAVENOUS

## 2018-12-25 MED ORDER — BUPIVACAINE-EPINEPHRINE 0.25% -1:200000 IJ SOLN
INTRAMUSCULAR | Status: DC | PRN
  Administered 2018-12-25: 30 mL

## 2018-12-25 MED ORDER — ACETAMINOPHEN 325 MG PO TABS: 325.0000 mg | ORAL_TABLET | ORAL | Status: DC | PRN

## 2018-12-25 MED ORDER — DEXAMETHASONE SODIUM PHOSPHATE 10 MG/ML IJ SOLN
INTRAMUSCULAR | Status: AC
  Filled 2018-12-25: qty 1

## 2018-12-25 MED ORDER — MEPERIDINE HCL 50 MG/ML IJ SOLN: 6.2500 mg | INTRAMUSCULAR | Status: DC | PRN

## 2018-12-25 MED ORDER — KETOROLAC TROMETHAMINE 30 MG/ML IJ SOLN
30.0000 mg | Freq: Four times a day (QID) | INTRAMUSCULAR | Status: DC
  Administered 2018-12-25 – 2018-12-26 (×3): 30 mg via INTRAVENOUS
  Filled 2018-12-25 (×4): qty 1

## 2018-12-25 MED ORDER — ACETAMINOPHEN 160 MG/5ML PO SOLN
325.0000 mg | ORAL | Status: DC | PRN
  Filled 2018-12-25: qty 20.3

## 2018-12-25 MED ORDER — FENTANYL CITRATE (PF) 100 MCG/2ML IJ SOLN
INTRAMUSCULAR | Status: AC
  Filled 2018-12-25: qty 2

## 2018-12-25 MED ORDER — ONDANSETRON HCL 4 MG/2ML IJ SOLN
INTRAMUSCULAR | Status: AC
  Filled 2018-12-25: qty 2

## 2018-12-25 MED ORDER — PROMETHAZINE HCL 25 MG/ML IJ SOLN: 6.2500 mg | INTRAMUSCULAR | Status: DC | PRN

## 2018-12-25 MED ORDER — MIDAZOLAM HCL 2 MG/2ML IJ SOLN
INTRAMUSCULAR | Status: AC
  Filled 2018-12-25: qty 2

## 2018-12-25 MED ORDER — ONDANSETRON HCL 4 MG/2ML IJ SOLN
INTRAMUSCULAR | Status: DC | PRN
  Administered 2018-12-25: 4 mg via INTRAVENOUS

## 2018-12-25 MED ORDER — HYDROMORPHONE HCL 1 MG/ML IJ SOLN
INTRAMUSCULAR | Status: AC
  Administered 2018-12-25: 11:00:00
  Filled 2018-12-25: qty 1

## 2018-12-25 MED ORDER — DEXAMETHASONE SODIUM PHOSPHATE 10 MG/ML IJ SOLN
INTRAMUSCULAR | Status: DC | PRN
  Administered 2018-12-25: 10 mg via INTRAVENOUS

## 2018-12-25 MED ORDER — PROPOFOL 10 MG/ML IV BOLUS
INTRAVENOUS | Status: AC
  Filled 2018-12-25: qty 20

## 2018-12-25 MED ORDER — BUPIVACAINE-EPINEPHRINE (PF) 0.25% -1:200000 IJ SOLN
INTRAMUSCULAR | Status: AC
  Filled 2018-12-25: qty 30

## 2018-12-25 MED ORDER — ACETAMINOPHEN 500 MG PO TABS
1000.0000 mg | ORAL_TABLET | Freq: Four times a day (QID) | ORAL | Status: DC
  Administered 2018-12-25 – 2018-12-26 (×4): 1000 mg via ORAL
  Filled 2018-12-25 (×4): qty 2

## 2018-12-25 MED ORDER — LACTATED RINGERS IV SOLN
INTRAVENOUS | Status: DC | PRN
  Administered 2018-12-25: 08:00:00 via INTRAVENOUS

## 2018-12-25 MED ORDER — ACETAMINOPHEN 10 MG/ML IV SOLN
INTRAVENOUS | Status: AC
  Filled 2018-12-25: qty 100

## 2018-12-25 MED ORDER — OXYCODONE HCL 5 MG PO TABS: 5.0000 mg | ORAL_TABLET | ORAL | Status: DC | PRN

## 2018-12-25 MED ORDER — SUGAMMADEX SODIUM 200 MG/2ML IV SOLN
INTRAVENOUS | Status: DC | PRN
  Administered 2018-12-25: 200 mg via INTRAVENOUS

## 2018-12-25 MED ORDER — PHENYLEPHRINE HCL (PRESSORS) 10 MG/ML IV SOLN
INTRAVENOUS | Status: DC | PRN
  Administered 2018-12-25 (×2): 100 ug via INTRAVENOUS

## 2018-12-25 MED ORDER — ROCURONIUM BROMIDE 100 MG/10ML IV SOLN
INTRAVENOUS | Status: DC | PRN
  Administered 2018-12-25: 40 mg via INTRAVENOUS
  Administered 2018-12-25 (×2): 10 mg via INTRAVENOUS

## 2018-12-25 MED ORDER — LIDOCAINE HCL (PF) 2 % IJ SOLN
INTRAMUSCULAR | Status: AC
  Filled 2018-12-25: qty 10

## 2018-12-25 MED ORDER — SUGAMMADEX SODIUM 200 MG/2ML IV SOLN
INTRAVENOUS | Status: AC
  Filled 2018-12-25: qty 2

## 2018-12-25 MED ORDER — MIDAZOLAM HCL 2 MG/2ML IJ SOLN
INTRAMUSCULAR | Status: DC | PRN
  Administered 2018-12-25: 2 mg via INTRAVENOUS

## 2018-12-25 SURGICAL SUPPLY — 43 items
APPLICATOR COTTON TIP 6 STRL (MISCELLANEOUS) IMPLANT
APPLICATOR COTTON TIP 6IN STRL (MISCELLANEOUS)
APPLIER CLIP 5 13 M/L LIGAMAX5 (MISCELLANEOUS) ×3
CANISTER SUCT 1200ML W/VALVE (MISCELLANEOUS) ×3 IMPLANT
CHLORAPREP W/TINT 26 (MISCELLANEOUS) ×3 IMPLANT
CHOLANGIOGRAM CATH TAUT (CATHETERS) IMPLANT
CLIP APPLIE 5 13 M/L LIGAMAX5 (MISCELLANEOUS) ×1 IMPLANT
COVER WAND RF STERILE (DRAPES) ×3 IMPLANT
DECANTER SPIKE VIAL GLASS SM (MISCELLANEOUS) IMPLANT
DERMABOND ADVANCED (GAUZE/BANDAGES/DRESSINGS) ×2
DERMABOND ADVANCED .7 DNX12 (GAUZE/BANDAGES/DRESSINGS) ×1 IMPLANT
DRAIN CHANNEL JP 19F (MISCELLANEOUS) ×3 IMPLANT
DRAPE C-ARM XRAY 36X54 (DRAPES) IMPLANT
ELECT CAUTERY BLADE 6.4 (BLADE) ×3 IMPLANT
ELECT REM PT RETURN 9FT ADLT (ELECTROSURGICAL) ×3
ELECTRODE REM PT RTRN 9FT ADLT (ELECTROSURGICAL) ×1 IMPLANT
GLOVE BIO SURGEON STRL SZ7 (GLOVE) ×9 IMPLANT
GOWN STRL REUS W/ TWL LRG LVL3 (GOWN DISPOSABLE) ×2 IMPLANT
GOWN STRL REUS W/TWL LRG LVL3 (GOWN DISPOSABLE) ×4
IRRIGATION STRYKERFLOW (MISCELLANEOUS) ×1 IMPLANT
IRRIGATOR STRYKERFLOW (MISCELLANEOUS) ×3
IV CATH ANGIO 12GX3 LT BLUE (NEEDLE) IMPLANT
IV NS 1000ML (IV SOLUTION) ×2
IV NS 1000ML BAXH (IV SOLUTION) ×1 IMPLANT
L-HOOK LAP DISP 36CM (ELECTROSURGICAL) ×3
LHOOK LAP DISP 36CM (ELECTROSURGICAL) ×1 IMPLANT
NEEDLE HYPO 22GX1.5 SAFETY (NEEDLE) ×3 IMPLANT
NS IRRIG 500ML POUR BTL (IV SOLUTION) ×3 IMPLANT
PACK LAP CHOLECYSTECTOMY (MISCELLANEOUS) ×3 IMPLANT
PENCIL ELECTRO HAND CTR (MISCELLANEOUS) ×3 IMPLANT
POUCH SPECIMEN RETRIEVAL 10MM (ENDOMECHANICALS) ×3 IMPLANT
SCISSORS METZENBAUM CVD 33 (INSTRUMENTS) ×3 IMPLANT
SET TUBE SMOKE EVAC HIGH FLOW (TUBING) ×3 IMPLANT
SLEEVE ENDOPATH XCEL 5M (ENDOMECHANICALS) ×6 IMPLANT
SPONGE LAP 18X18 RF (DISPOSABLE) ×3 IMPLANT
STOPCOCK 4 WAY LG BORE MALE ST (IV SETS) IMPLANT
SUCT RESERVOIR 100CC (MISCELLANEOUS) ×3 IMPLANT
SUT ETHIBOND 0 MO6 C/R (SUTURE) IMPLANT
SUT MNCRL AB 4-0 PS2 18 (SUTURE) ×3 IMPLANT
SUT VICRYL 0 AB UR-6 (SUTURE) ×6 IMPLANT
SYR 20ML LL LF (SYRINGE) ×3 IMPLANT
TROCAR XCEL BLUNT TIP 100MML (ENDOMECHANICALS) ×3 IMPLANT
TROCAR XCEL NON-BLD 5MMX100MML (ENDOMECHANICALS) ×3 IMPLANT

## 2018-12-25 NOTE — Anesthesia Postprocedure Evaluation (Signed)
Anesthesia Post Note  Patient: Casey Hatfield  Procedure(s) Performed: LAPAROSCOPIC CHOLECYSTECTOMY (N/A )  Patient location during evaluation: PACU Anesthesia Type: General Level of consciousness: awake and alert Pain management: pain level controlled Vital Signs Assessment: post-procedure vital signs reviewed and stable Respiratory status: spontaneous breathing, nonlabored ventilation and respiratory function stable Cardiovascular status: blood pressure returned to baseline and stable Postop Assessment: no apparent nausea or vomiting Anesthetic complications: no     Last Vitals:  Vitals:   12/25/18 1105 12/25/18 1300  BP: 126/73 122/74  Pulse: 76 74  Resp: 16 16  Temp: 36.7 C 36.8 C  SpO2: 93% 93%    Last Pain:  Vitals:   12/25/18 1038  TempSrc:   PainSc: Asleep                 Alphonsus Sias

## 2018-12-25 NOTE — Op Note (Signed)
Laparoscopic Cholecystectomy  Pre-operative Diagnosis: Acute cholecystitis  Post-operative Diagnosis: same  Procedure: Laparoscopic cholecystectomy  Surgeon: Caroleen Hamman, MD FACS  Anesthesia: Gen. with endotracheal tube   Findings: Severe Acute Cholecystitis w some gangrenous component   Estimated Blood Loss: 50cc         Drains: 19 FR RUQ         Specimens: Gallbladder           Complications: none   Procedure Details  The patient was seen again in the Holding Room. The benefits, complications, treatment options, and expected outcomes were discussed with the patient. The risks of bleeding, infection, recurrence of symptoms, failure to resolve symptoms, bile duct damage, bile duct leak, retained common bile duct stone, bowel injury, any of which could require further surgery and/or ERCP, stent, or papillotomy were reviewed with the patient. The likelihood of improving the patient's symptoms with return to their baseline status is good.  The patient and/or family concurred with the proposed plan, giving informed consent.  The patient was taken to Operating Room, identified as Casey Hatfield and the procedure verified as Laparoscopic Cholecystectomy.  A Time Out was held and the above information confirmed.  Prior to the induction of general anesthesia, antibiotic prophylaxis was administered. VTE prophylaxis was in place. General endotracheal anesthesia was then administered and tolerated well. After the induction, the abdomen was prepped with Chloraprep and draped in the sterile fashion. The patient was positioned in the supine position.  Cut down technique was used to enter the abdominal cavity and a Hasson trochar was placed after two vicryl stitches were anchored to the fascia. Pneumoperitoneum was then created with CO2 and tolerated well without any adverse changes in the patient's vital signs.  Three 5-mm ports were placed in the right upper quadrant all under direct vision.  All skin incisions  were infiltrated with a local anesthetic agent before making the incision and placing the trocars.   The patient was positioned  in reverse Trendelenburg, tilted slightly to the patient's left.  The gallbladder was identified, the fundus grasped and retracted cephalad. GB was severely inflamed with hydrops that I release with the suction divide. The omentum was attached to the gb and  Adhesions were lysed with cautery and suction device. The infundibulum was grasped and retracted laterally, exposing the peritoneum overlying the triangle of Calot. This was then divided and exposed in a blunt fashion. An extended critical view of the cystic duct and cystic artery was obtained.  The cystic duct was clearly identified and bluntly dissected.   Artery and duct were double clipped and divided.  The gallbladder was taken from the gallbladder fossa in a retrograde fashion with the electrocautery. The gallbladder was removed and placed in an Endocatch bag. The liver bed was irrigated and inspected. Hemostasis was achieved with the electrocautery. Copious irrigation was utilized and was repeatedly aspirated until clear.  The gallbladder and Endocatch sac were then removed through a port site.   [place] drain was placed RUQ and secured to the skin.  Inspection of the right upper quadrant was performed. No bleeding, bile duct injury or leak, or bowel injury was noted. Pneumoperitoneum was released.  The periumbilical port site was closed with interrumpted 0 Vicryl sutures. 4-0 subcuticular Monocryl was used to close the skin. Dermabond was  applied.  The patient was then extubated and brought to the recovery room in stable condition. Sponge, lap, and needle counts were correct at closure and at the  conclusion of the case.               Caroleen Hamman, MD, FACS

## 2018-12-25 NOTE — Progress Notes (Signed)
Patient has been NPO since MN and consent signed on placed in chart. Will endorse.

## 2018-12-25 NOTE — Progress Notes (Signed)
Samsung galaxy 10 cell phone and 1 ring in drawer

## 2018-12-25 NOTE — Anesthesia Post-op Follow-up Note (Signed)
Anesthesia QCDR form completed.        

## 2018-12-25 NOTE — Anesthesia Preprocedure Evaluation (Addendum)
Anesthesia Evaluation  Patient identified by MRN, date of birth, ID band Patient awake    Reviewed: Allergy & Precautions, H&P , NPO status , reviewed documented beta blocker date and time   Airway Mallampati: II  TM Distance: >3 FB Neck ROM: full    Dental  (+) Teeth Intact, Dental Advidsory Given   Pulmonary    Pulmonary exam normal        Cardiovascular hypertension, Normal cardiovascular exam     Neuro/Psych    GI/Hepatic   Endo/Other    Renal/GU      Musculoskeletal   Abdominal   Peds  Hematology   Anesthesia Other Findings Past Medical History: No date: Hypertension Past Surgical History: No date: LAPAROSCOPY BMI    Body Mass Index: 39.38 kg/m     Reproductive/Obstetrics                            Anesthesia Physical Anesthesia Plan  ASA: II and emergent  Anesthesia Plan: General   Post-op Pain Management:    Induction: Intravenous  PONV Risk Score and Plan: 3 and Midazolam, Dexamethasone, Ondansetron and Treatment may vary due to age or medical condition  Airway Management Planned: Oral ETT  Additional Equipment:   Intra-op Plan:   Post-operative Plan: Extubation in OR  Informed Consent: I have reviewed the patients History and Physical, chart, labs and discussed the procedure including the risks, benefits and alternatives for the proposed anesthesia with the patient or authorized representative who has indicated his/her understanding and acceptance.     Dental Advisory Given  Plan Discussed with: CRNA  Anesthesia Plan Comments:        Anesthesia Quick Evaluation

## 2018-12-25 NOTE — Transfer of Care (Signed)
Immediate Anesthesia Transfer of Care Note  Patient: Casey Hatfield  Procedure(s) Performed: LAPAROSCOPIC CHOLECYSTECTOMY (N/A )  Patient Location: PACU  Anesthesia Type:General  Level of Consciousness: sedated  Airway & Oxygen Therapy: Patient Spontanous Breathing and Patient connected to face mask oxygen  Post-op Assessment: Report given to RN, Post -op Vital signs reviewed and stable and Patient moving all extremities X 4  Post vital signs: Reviewed and stable  Last Vitals:  Vitals Value Taken Time  BP 146/76 12/25/18 0952  Temp    Pulse 97 12/25/18 0954  Resp 14 12/25/18 0954  SpO2 100 % 12/25/18 0954  Vitals shown include unvalidated device data.  Last Pain:  Vitals:   12/25/18 0528  TempSrc:   PainSc: Asleep      Patients Stated Pain Goal: 0 (41/28/78 6767)  Complications: No apparent anesthesia complications

## 2018-12-25 NOTE — Anesthesia Procedure Notes (Signed)
Procedure Name: Intubation Date/Time: 12/25/2018 8:20 AM Performed by: Caryl Asp, CRNA Pre-anesthesia Checklist: Patient identified, Patient being monitored, Timeout performed, Emergency Drugs available and Suction available Patient Re-evaluated:Patient Re-evaluated prior to induction Oxygen Delivery Method: Circle system utilized Preoxygenation: Pre-oxygenation with 100% oxygen Induction Type: IV induction Ventilation: Mask ventilation without difficulty Laryngoscope Size: 3 and McGraph Grade View: Grade I Tube type: Oral Tube size: 7.0 mm Number of attempts: 1 Airway Equipment and Method: Stylet and Video-laryngoscopy Placement Confirmation: ETT inserted through vocal cords under direct vision,  positive ETCO2 and breath sounds checked- equal and bilateral Secured at: 21 cm Tube secured with: Tape Dental Injury: Teeth and Oropharynx as per pre-operative assessment

## 2018-12-26 MED ORDER — CIPROFLOXACIN HCL 500 MG PO TABS: 500.0000 mg | ORAL_TABLET | Freq: Two times a day (BID) | ORAL | 0 refills | Status: AC

## 2018-12-26 MED ORDER — METRONIDAZOLE 500 MG PO TABS: 500.0000 mg | ORAL_TABLET | Freq: Three times a day (TID) | ORAL | 0 refills | Status: AC

## 2018-12-26 MED ORDER — OXYCODONE HCL 5 MG PO TABS: 5.0000 mg | ORAL_TABLET | ORAL | 0 refills | Status: DC | PRN

## 2018-12-26 NOTE — Discharge Instructions (Signed)
In addition to included general post-operative instructions for laparoscopic cholecystectomy (Gallbladder removal),  Diet: Resume home diet.Gradually increase diet. You may notice intermittent diarrhea with fatty foods, this is normal  Activity: No heavy lifting >20 pounds (children, pets, laundry, garbage) for at least 4 weeks post-op, but light activity and walking are encouraged. Do not drive or drink alcohol if taking narcotic pain medications or having pain that might distract from driving.  Wound care: 2 days after surgery (09/15), you may shower/get incision wet with soapy water and pat dry (do not rub incisions), but no baths or submerging incision underwater until follow-up.   Medications: Resume all home medications. For mild to moderate pain: acetaminophen (Tylenol) or ibuprofen/naproxen (if no kidney disease). Combining Tylenol with alcohol can substantially increase your risk of causing liver disease. Narcotic pain medications, if prescribed, can be used for severe pain, though may cause nausea, constipation, and drowsiness. Do not combine Tylenol and Percocet (or similar) within a 6 hour period as Percocet (and similar) contain(s) Tylenol. If you do not need the narcotic pain medication, you do not need to fill the prescription.  Driving: Do not drive if you are taking narcotic pain medications. Prior to driving, ensure that you can sit in the driver's seat, turn side to side, and react appropriately prior to returning to driving  Call office [phone] / [phone]) at any time if any questions, worsening pain, fevers/chills, bleeding, drainage from incision site, or other concerns.

## 2018-12-26 NOTE — Progress Notes (Addendum)
Pt ready for discharge home today per MD. Patient assessment unchanged from this morning. Pt did have BM today and has bowel sounds. Pt will be d/c with JP drain per Dr. Dahlia Byes. Pt educated on how to empty drain and she demonstrated it back to me and how to change drain dressing. Reviewed discharge instructions and prescriptions with pt; all questions answered and pt verbalized understanding. PIV removed, VSS. Pt assisted to car via NT.   Casey Hatfield

## 2018-12-26 NOTE — Discharge Summary (Signed)
[facility] SURGICAL DISCHARGE SUMMARY  Patient ID: Casey Hatfield MRN: [id number] DOB/AGE: 10-20-1970 48 y.o.  Admit date: 12/23/2018 Discharge date: 12/26/2018  Discharge Diagnoses Patient Active Problem List   Diagnosis Date Noted  . Cholecystitis 12/25/2018  . Abdominal pain   . Intractable nausea and vomiting 12/23/2018    Consultants GI --> follow up outpatient colonoscopy Medicine  Procedures 12/25/2018 Laparoscopic Cholecystectomy  HPI: Casey Hatfield is a 48 y.o. female. She came in yesterday for intractable nausea vomiting abdominal pain.  She reports that her pain was located in the epigastric area and radiated to the right upper quadrant.  Intermittent nature moderate and without specific alleviating or aggravating factors.  The pain now is somewhat better.  She has been taking clears but her appetite has decreased.  No fevers no chills no evidence of obstructive jaundice.  She did have an ultrasound, MRCP and CT scan that I have personally reviewed no evidence of cholelithiasis.  No evidence of choledocholithiasis.  White count is slightly elevated at 11.5 hemoglobin is 10.9 and platelets are normal.  CMP is completely normal.  There was some transient AST and ALT elevation that has normalized now. She was admitted to medicine with GI and surgery consultation.   Hospital Course: She ultimately was determined to have suspected to acute cholecystitis and general surgery assumed care of this patient. Informed consent was obtained and documented, and patient underwent laparoscopic cholecystectomy which revealed severely inflamed GB for which drain was left in place (Dr Casey Hatfield, 12/25/2018).  Post-operatively, patient's pain and symtpoms improved/resolved and advancement of patient's diet and ambulation were well-tolerated. The remainder of patient's hospital course was essentially unremarkable, and discharge planning was initiated accordingly with patient safely  able to be discharged home with appropriate discharge instructions, antibiotics (Cipro + Flagyl x10 days), pain control, and outpatient  follow-up after all of her questions were answered to her expressed satisfaction.   Discharge Condition: Good   Physical Examination:  Constitutional: Well appearing female, NAD HEENT: No scleral icterus Pulmonary: normal effort, no respiratory distress Gastrointestinal: Soft, incisional soreness, non-distended, no rebound/guarding. JP in RUQ with serosanguinous output Skin: laparoscopic incisions are CDI with dermabond, no erythema or drainage   Allergies as of 12/26/2018      Reactions   Penicillins Hives   Shellfish Allergy Anaphylaxis      Medication List    TAKE these medications   ciprofloxacin 500 MG tablet Commonly known as: Cipro Take 1 tablet (500 mg total) by mouth 2 (two) times daily for 10 days.   hydrochlorothiazide 12.5 MG capsule Commonly known as: MICROZIDE Take 12.5 mg by mouth daily.   Iron 325 (65 Fe) MG Tabs Take 1 tablet by mouth daily.   metroNIDAZOLE 500 MG tablet Commonly known as: Flagyl Take 1 tablet (500 mg total) by mouth 3 (three) times daily for 10 days.   oxyCODONE 5 MG immediate release tablet Commonly known as: Oxy IR/ROXICODONE Take 1 tablet (5 mg total) by mouth every 3 (three) hours as needed for moderate pain.        Follow-up Information    Pabon, [place] F, MD. Schedule an appointment as soon as possible for a visit on 12/29/2018.   Specialty: General Surgery Why: Hatfield/u on 09/17 with Casey Mins PA, s/p lap chole, has JP drain Contact information: [address] [place] [address] [phone]            Time spent on discharge management including discussion of hospital course,  clinical condition, outpatient instructions, prescriptions, and follow up with the patient and members of the medical team: >30 minutes  -- Casey Hatfield , PA-C [facility]   12/26/2018, 8:59 AM [phone] M-Hatfield: 7am - 4pm

## 2018-12-27 LAB — HIV ANTIBODY (ROUTINE TESTING W REFLEX): HIV Screen 4th Generation wRfx: NONREACTIVE

## 2018-12-27 LAB — SURGICAL PATHOLOGY

## 2018-12-29 ENCOUNTER — Ambulatory Visit (INDEPENDENT_AMBULATORY_CARE_PROVIDER_SITE_OTHER): Admitting: Physician Assistant

## 2018-12-29 ENCOUNTER — Encounter: Payer: Self-pay | Admitting: Physician Assistant

## 2018-12-29 ENCOUNTER — Other Ambulatory Visit: Payer: Self-pay

## 2018-12-29 VITALS — BP 147/72 | HR 91 | Temp 97.7°F | Ht 60.0 in | Wt 243.0 lb

## 2018-12-29 DIAGNOSIS — K819 Cholecystitis, unspecified: Secondary | ICD-10-CM

## 2018-12-29 DIAGNOSIS — Z09 Encounter for follow-up examination after completed treatment for conditions other than malignant neoplasm: Secondary | ICD-10-CM

## 2018-12-29 NOTE — Progress Notes (Signed)
[facility] POST-OP OFFICE VISIT  12/29/2018  HPI: Casey Hatfield is a 48 y.o. female 4 days s/p laparoscopic cholecystectomy for cholecystitis with Dr Dahlia Byes.   Overall, she reports she is doing well. Pain controlled with tylenol. She reported diarrhea POD1-2 but this has improved. No nausea, emesis, fever, or chills. JP with minimal serous output. No other acute issues or concerns.   Vital signs: BP (!) 147/72   Pulse 91   Temp 97.7 F (36.5 C)   Ht 5' (1.524 m)   Wt 243 lb (110.2 kg)   SpO2 99%   BMI 47.46 kg/m    Physical Exam: Constitutional: Well appearing female, NAD Abdomen: Soft, non-tender, non-distended, no rebound.guarding. JP in RUQ with serous output.  Skin: Laparoscopic incisions are CDI with dermabond, no erythema or drainage.   Assessment/Plan: This is a 48 y.o. female 4 days s/p laparoscopic cholecystectomy   - pain control prn  - finish ABx (Cipro + Flagyl)  - Removed JP in clinic; occlusive dressing placed  - okay to shower  - complete 4 weeks lifting restrictions; light activity okay  - discussed dietary recommendations and expected diarrhea with fatty/greasy meals  - pathology reviewed  - rtc in 2 weeks  -- Edison Simon, PA-C [facility] 12/29/2018, 10:18 AM [phone] M-F: 7am - 4pm

## 2018-12-29 NOTE — Patient Instructions (Signed)
May drive if not taking pain medications. Return in two weeks.

## 2019-01-12 ENCOUNTER — Other Ambulatory Visit: Payer: Self-pay

## 2019-01-12 ENCOUNTER — Encounter: Payer: Self-pay | Admitting: Physician Assistant

## 2019-01-12 ENCOUNTER — Ambulatory Visit (INDEPENDENT_AMBULATORY_CARE_PROVIDER_SITE_OTHER): Admitting: Physician Assistant

## 2019-01-12 VITALS — BP 120/82 | HR 86 | Temp 97.3°F | Resp 12 | Ht 66.0 in | Wt 238.8 lb

## 2019-01-12 DIAGNOSIS — Z09 Encounter for follow-up examination after completed treatment for conditions other than malignant neoplasm: Secondary | ICD-10-CM

## 2019-01-12 DIAGNOSIS — K819 Cholecystitis, unspecified: Secondary | ICD-10-CM

## 2019-01-12 NOTE — Patient Instructions (Signed)
Please call our office if you have any questions or concerns. Please continue to increase your activity over the next few weeks. Please allow yourself two more weeks before returning to work.

## 2019-01-12 NOTE — Progress Notes (Signed)
[facility] POST-OP OFFICE VISIT  01/12/2019  HPI: Casey Hatfield is a 48 y.o. female  4 weeks s/p  laparoscopic cholecystectomy.   Overall, she reports that she is doing well. No issues with pain. No fever, chills, nausea, or emesis. Tolerating diet. Gradually increasing activity. Intermittent diarrhea. No other issues.   Vital signs: BP 120/82   Pulse 86   Temp (!) 97.3 F (36.3 C) (Temporal)   Resp 12   Ht 5\' 6"  (1.676 m)   Wt 238 lb 12.8 oz (108.3 kg)   SpO2 98%   BMI 38.54 kg/m    Physical Exam: Constitutional: Well appearing female, NAD Abdomen: Soft, non-tender, non-distended, no rebound/guarding Skin: Laparoscopic incisions are CDI, no erythema or drainage.   Assessment/Plan: This is a 48 y.o. female 4 weeks s/p  laparoscopic cholecystectomy.   - Pain control prn  - Imodium for diarrhea  - gradually increase activity over next 2 weeks; letter provided  - follow up prn  -- Edison Simon, PA-C [facility] 01/12/2019, 9:44 AM [phone] M-F: 7am - 4pm

## 2019-01-13 ENCOUNTER — Telehealth: Payer: Self-pay

## 2019-01-13 NOTE — Telephone Encounter (Signed)
FMLA paperwork has been filled out for the patient. She will pick this up today or on Monday.

## 2019-01-18 ENCOUNTER — Telehealth: Payer: Self-pay

## 2019-01-18 NOTE — Telephone Encounter (Signed)
FMLA life span paperwork completed and faxed to [phone]. Patient notified.

## 2019-06-10 ENCOUNTER — Ambulatory Visit: Payer: Self-pay | Attending: Internal Medicine

## 2019-06-10 ENCOUNTER — Other Ambulatory Visit: Payer: Self-pay

## 2019-06-10 DIAGNOSIS — Z23 Encounter for immunization: Secondary | ICD-10-CM

## 2019-06-10 NOTE — Progress Notes (Signed)
   Covid-19 Vaccination Clinic  Name:  SHERRICA NIEHAUS    MRN: [id number] DOB: 1971/04/09  06/10/2019  Ms. Calico was observed post Covid-19 immunization for 15 minutes without incidence. She was provided with Vaccine Information Sheet and instruction to access the V-Safe system.   Ms. Pederson was instructed to call 911 with any severe reactions post vaccine: Marland Kitchen Difficulty breathing  . Swelling of your face and throat  . A fast heartbeat  . A bad rash all over your body  . Dizziness and weakness    Immunizations Administered    Name Date Dose VIS Date Route   Moderna COVID-19 Vaccine 06/10/2019 10:06 AM 0.5 mL 03/14/2019 Intramuscular   Manufacturer: Moderna   Lot: [id number]   NDC: 63817-711-65

## 2019-07-08 ENCOUNTER — Ambulatory Visit: Payer: Self-pay | Attending: Internal Medicine

## 2019-07-08 DIAGNOSIS — Z23 Encounter for immunization: Secondary | ICD-10-CM

## 2019-07-08 NOTE — Progress Notes (Signed)
   Covid-19 Vaccination Clinic  Name:  Casey Hatfield    MRN: [id number] DOB: 1971/02/24  07/08/2019  Casey Hatfield was observed post Covid-19 immunization for 15 minutes without incident. She was provided with Vaccine Information Sheet and instruction to access the V-Safe system.   Casey Hatfield was instructed to call 911 with any severe reactions post vaccine: Marland Kitchen Difficulty breathing  . Swelling of face and throat  . A fast heartbeat  . A bad rash all over body  . Dizziness and weakness   Immunizations Administered    Name Date Dose VIS Date Route   Moderna COVID-19 Vaccine 07/08/2019 11:23 AM 0.5 mL 03/14/2019 Intramuscular   Manufacturer: Gala Murdoch   Lot: [id number]   NDC: 20947-096-28

## 2019-07-11 ENCOUNTER — Ambulatory Visit: Payer: Self-pay

## 2019-12-08 ENCOUNTER — Other Ambulatory Visit: Payer: Self-pay | Admitting: Family Medicine

## 2019-12-08 DIAGNOSIS — Z1231 Encounter for screening mammogram for malignant neoplasm of breast: Secondary | ICD-10-CM

## 2019-12-22 ENCOUNTER — Ambulatory Visit
Admission: RE | Admit: 2019-12-22 | Discharge: 2019-12-22 | Disposition: A | Discharge status: Home / Self Care | Source: Ambulatory Visit | Attending: Family Medicine | Admitting: Family Medicine

## 2019-12-22 DIAGNOSIS — Z1231 Encounter for screening mammogram for malignant neoplasm of breast: Secondary | ICD-10-CM | POA: Insufficient documentation

## 2020-05-22 ENCOUNTER — Other Ambulatory Visit: Payer: Self-pay

## 2020-05-22 ENCOUNTER — Other Ambulatory Visit
Admission: RE | Admit: 2020-05-22 | Discharge: 2020-05-22 | Disposition: A | Discharge status: Home / Self Care | Source: Ambulatory Visit | Attending: Gastroenterology | Admitting: Gastroenterology

## 2020-05-22 DIAGNOSIS — Z01812 Encounter for preprocedural laboratory examination: Secondary | ICD-10-CM | POA: Diagnosis present

## 2020-05-22 DIAGNOSIS — Z20822 Contact with and (suspected) exposure to covid-19: Secondary | ICD-10-CM | POA: Diagnosis not present

## 2020-05-22 LAB — SARS CORONAVIRUS 2 (TAT 6-24 HRS): SARS Coronavirus 2: NEGATIVE

## 2020-05-24 ENCOUNTER — Ambulatory Visit: Admitting: Anesthesiology

## 2020-05-24 ENCOUNTER — Encounter
Admission: RE | Disposition: A | Discharge status: Home / Self Care | Payer: Self-pay | Source: Home / Self Care | Attending: Gastroenterology

## 2020-05-24 ENCOUNTER — Encounter: Payer: Self-pay | Admitting: *Deleted

## 2020-05-24 ENCOUNTER — Other Ambulatory Visit: Payer: Self-pay

## 2020-05-24 ENCOUNTER — Ambulatory Visit
Admission: RE | Admit: 2020-05-24 | Discharge: 2020-05-24 | Disposition: A | Discharge status: Home / Self Care | Attending: Gastroenterology | Admitting: Gastroenterology

## 2020-05-24 DIAGNOSIS — Z881 Allergy status to other antibiotic agents status: Secondary | ICD-10-CM | POA: Insufficient documentation

## 2020-05-24 DIAGNOSIS — Z79899 Other long term (current) drug therapy: Secondary | ICD-10-CM | POA: Insufficient documentation

## 2020-05-24 DIAGNOSIS — Z6838 Body mass index (BMI) 38.0-38.9, adult: Secondary | ICD-10-CM | POA: Diagnosis not present

## 2020-05-24 DIAGNOSIS — Z1211 Encounter for screening for malignant neoplasm of colon: Secondary | ICD-10-CM | POA: Insufficient documentation

## 2020-05-24 DIAGNOSIS — Z91013 Allergy to seafood: Secondary | ICD-10-CM | POA: Insufficient documentation

## 2020-05-24 DIAGNOSIS — K5730 Diverticulosis of large intestine without perforation or abscess without bleeding: Secondary | ICD-10-CM | POA: Insufficient documentation

## 2020-05-24 DIAGNOSIS — Z880 Allergy status to penicillin: Secondary | ICD-10-CM | POA: Insufficient documentation

## 2020-05-24 HISTORY — DX: Personal history of other diseases of the female genital tract: Z87.42

## 2020-05-24 HISTORY — PX: COLONOSCOPY: SHX5424

## 2020-05-24 HISTORY — DX: Morbid (severe) obesity due to excess calories: E66.01

## 2020-05-24 HISTORY — DX: Iron deficiency anemia, unspecified: D50.9

## 2020-05-24 HISTORY — DX: Age-related osteoporosis without current pathological fracture: M81.0

## 2020-05-24 LAB — POCT PREGNANCY, URINE: Preg Test, Ur: NEGATIVE

## 2020-05-24 SURGERY — COLONOSCOPY
Anesthesia: General

## 2020-05-24 MED ORDER — PROPOFOL 10 MG/ML IV BOLUS
INTRAVENOUS | Status: DC | PRN
  Administered 2020-05-24: 60 mg via INTRAVENOUS

## 2020-05-24 MED ORDER — PROPOFOL 500 MG/50ML IV EMUL
INTRAVENOUS | Status: DC | PRN
  Administered 2020-05-24: 200 ug/kg/min via INTRAVENOUS

## 2020-05-24 MED ORDER — SODIUM CHLORIDE 0.9 % IV SOLN: INTRAVENOUS | Status: DC

## 2020-05-24 NOTE — Anesthesia Postprocedure Evaluation (Signed)
Anesthesia Post Note  Patient: Casey Hatfield  Procedure(s) Performed: COLONOSCOPY (N/A )  Patient location during evaluation: Endoscopy Anesthesia Type: General Level of consciousness: awake and alert Pain management: pain level controlled Vital Signs Assessment: post-procedure vital signs reviewed and stable Respiratory status: spontaneous breathing, nonlabored ventilation, respiratory function stable and patient connected to nasal cannula oxygen Cardiovascular status: blood pressure returned to baseline and stable Postop Assessment: no apparent nausea or vomiting Anesthetic complications: no   No complications documented.   Last Vitals:  Vitals:   05/24/20 1449 05/24/20 1459  BP: (!) 98/51 99/70  Pulse: 81 76  Resp: 20 20  Temp: 36.8 C   SpO2: 100% 100%    Last Pain:  Vitals:   05/24/20 1449  TempSrc: Temporal  PainSc: Asleep                 Corinda Gubler

## 2020-05-24 NOTE — Anesthesia Preprocedure Evaluation (Signed)
Anesthesia Evaluation  Patient identified by MRN, date of birth, ID band Patient awake    Reviewed: Allergy & Precautions, NPO status , Patient's Chart, lab work & pertinent test results  History of Anesthesia Complications Negative for: history of anesthetic complications  Airway Mallampati: II  TM Distance: >3 FB Neck ROM: Full    Dental no notable dental hx. (+) Teeth Intact   Pulmonary neg pulmonary ROS, neg sleep apnea, neg COPD, Patient abstained from smoking.Not current smoker,    Pulmonary exam normal breath sounds clear to auscultation       Cardiovascular Exercise Tolerance: Good METShypertension, Pt. on medications (-) CAD and (-) Past MI (-) dysrhythmias  Rhythm:Regular Rate:Normal - Systolic murmurs    Neuro/Psych negative neurological ROS  negative psych ROS   GI/Hepatic neg GERD  ,(+)     (-) substance abuse  ,   Endo/Other  neg diabetes  Renal/GU negative Renal ROS     Musculoskeletal   Abdominal   Peds  Hematology   Anesthesia Other Findings Past Medical History: No date: H/O infertility No date: Hypertension No date: IDA (iron deficiency anemia) No date: Morbid obesity (HCC) No date: Osteoporosis  Reproductive/Obstetrics                             Anesthesia Physical Anesthesia Plan  ASA: II  Anesthesia Plan: General   Post-op Pain Management:    Induction: Intravenous  PONV Risk Score and Plan: 3 and Ondansetron, Propofol infusion and TIVA  Airway Management Planned: Nasal Cannula  Additional Equipment: None  Intra-op Plan:   Post-operative Plan:   Informed Consent: I have reviewed the patients History and Physical, chart, labs and discussed the procedure including the risks, benefits and alternatives for the proposed anesthesia with the patient or authorized representative who has indicated his/her understanding and acceptance.     Dental  advisory given  Plan Discussed with: CRNA and Surgeon  Anesthesia Plan Comments: (Discussed risks of anesthesia with patient, including possibility of difficulty with spontaneous ventilation under anesthesia necessitating airway intervention, PONV, and rare risks such as cardiac or respiratory or neurological events. Patient understands.)        Anesthesia Quick Evaluation

## 2020-05-24 NOTE — Anesthesia Procedure Notes (Signed)
Date/Time: 05/24/2020 2:37 PM Performed by: Junious Silk, CRNA Pre-anesthesia Checklist: Patient identified, Emergency Drugs available, Suction available, Patient being monitored and Timeout performed Oxygen Delivery Method: Nasal cannula

## 2020-05-24 NOTE — H&P (Signed)
Outpatient short stay form Pre-procedure 05/24/2020 1:46 PM Merlyn Lot MD, MPH  Primary Physician: Dr. Burnadette Pop  Reason for visit:  Screening  History of present illness:   50 y/o lady with history of obesity and hypertension here for screening colonoscopy. No family history of GI malignancies. No blood thinners. History of laprascopic surgery for ovarian cyst. No new GI symptoms.    Current Facility-Administered Medications:  .  0.9 %  sodium chloride infusion, , Intravenous, Continuous, Junell Cullifer, Rossie Muskrat, MD, Last Rate: 20 mL/hr at 05/24/20 1313, New Bag at 05/24/20 1313  Medications Prior to Admission  Medication Sig Dispense Refill Last Dose  . Ferrous Sulfate (IRON) 325 (65 Fe) MG TABS Take 1 tablet by mouth daily.   Past Week at Unknown time  . hydrochlorothiazide (MICROZIDE) 12.5 MG capsule Take 12.5 mg by mouth daily.   05/23/2020 at 0800  . Multiple Vitamin (MULTIVITAMIN) tablet Take 1 tablet by mouth daily.   Past Week at Unknown time     Allergies  Allergen Reactions  . Penicillins Hives  . Shellfish Allergy Anaphylaxis  . Amoxil [Amoxicillin]      Past Medical History:  Diagnosis Date  . H/O infertility   . Hypertension   . IDA (iron deficiency anemia)   . Morbid obesity (HCC)   . Osteoporosis     Review of systems:  Otherwise negative.    Physical Exam  Gen: Alert, oriented. Appears stated age.  HEENT: PERRLA. Lungs: No respiratory distress CV: RRR Abd: soft, benign, no masses Ext: No edema    Planned procedures: Proceed with colonoscopy. The patient understands the nature of the planned procedure, indications, risks, alternatives and potential complications including but not limited to bleeding, infection, perforation, damage to internal organs and possible oversedation/side effects from anesthesia. The patient agrees and gives consent to proceed.  Please refer to procedure notes for findings, recommendations and patient  disposition/instructions.     Merlyn Lot MD, MPH Gastroenterology 05/24/2020  1:46 PM

## 2020-05-24 NOTE — Op Note (Signed)
[facility] Patient Name: Casey Hatfield Procedure Date: 05/24/2020 2:27 PM MRN: [id number] Account #: [id number] Date of Birth: 1971/03/31 Admit Type: Outpatient Age: 50 Room: [facility] ENDO ROOM 3 Gender: Female Note Status: Finalized Procedure:             Colonoscopy Indications:           Screening for colorectal malignant neoplasm Providers:             Andrey Farmer MD, MD Referring MD:          Dion Body (Referring MD) Medicines:             Monitored Anesthesia Care Complications:         No immediate complications. Procedure:             Pre-Anesthesia Assessment:                        - Prior to the procedure, a History and Physical was                         performed, and patient medications and allergies were                         reviewed. The patient is competent. The risks and                         benefits of the procedure and the sedation options and                         risks were discussed with the patient. All questions                         were answered and informed consent was obtained.                         Patient identification and proposed procedure were                         verified by the physician, the nurse, the anesthetist                         and the technician in the endoscopy suite. Mental                         Status Examination: alert and oriented. Airway                         Examination: normal oropharyngeal airway and neck                         mobility. Respiratory Examination: clear to                         auscultation. CV Examination: normal. Prophylactic                         Antibiotics: The patient does not require prophylactic  antibiotics. Prior Anticoagulants: The patient has                         taken no previous anticoagulant or antiplatelet                         agents. ASA Grade Assessment: II - A patient with mild                          systemic disease. After reviewing the risks and                         benefits, the patient was deemed in satisfactory                         condition to undergo the procedure. The anesthesia                         plan was to use monitored anesthesia care (MAC).                         Immediately prior to administration of medications,                         the patient was re-assessed for adequacy to receive                         sedatives. The heart rate, respiratory rate, oxygen                         saturations, blood pressure, adequacy of pulmonary                         ventilation, and response to care were monitored                         throughout the procedure. The physical status of the                         patient was re-assessed after the procedure.                        After obtaining informed consent, the colonoscope was                         passed under direct vision. Throughout the procedure,                         the patient's blood pressure, pulse, and oxygen                         saturations were monitored continuously. The                         Colonoscope was introduced through the anus and                         advanced to the the cecum, identified by appendiceal  orifice and ileocecal valve. The colonoscopy was                         performed without difficulty. The patient tolerated                         the procedure well. The quality of the bowel                         preparation was good. Findings:      The perianal and digital rectal examinations were normal.      A few small-mouthed diverticula were found in the splenic flexure.      The exam was otherwise without abnormality on direct and retroflexion       views. Impression:            - Diverticulosis at the splenic flexure.                        - The examination was otherwise normal on direct and                         retroflexion  views.                        - No specimens collected. Recommendation:        - Discharge patient to home.                        - Resume previous diet.                        - Continue present medications.                        - Repeat colonoscopy in 10 years for screening                         purposes.                        - Return to referring physician as previously                         scheduled. Procedure Code(s):     --- Professional ---                        H3716, Colorectal cancer screening; colonoscopy on                         individual not meeting criteria for high risk Diagnosis Code(s):     --- Professional ---                        Z12.11, Encounter for screening for malignant neoplasm                         of colon                        K57.30, Diverticulosis of large intestine without  perforation or abscess without bleeding CPT copyright 2019 American Medical Association. All rights reserved. The codes documented in this report are preliminary and upon coder review may  be revised to meet current compliance requirements. Andrey Farmer MD, MD 05/24/2020 2:49:39 PM Number of Addenda: 0 Note Initiated On: 05/24/2020 2:27 PM Scope Withdrawal Time: 0 hours 6 minutes 44 seconds  Total Procedure Duration: 0 hours 11 minutes 46 seconds  Estimated Blood Loss:  Estimated blood loss: none.      [facility]

## 2020-05-24 NOTE — Transfer of Care (Signed)
Immediate Anesthesia Transfer of Care Note  Patient: Casey Hatfield  Procedure(s) Performed: COLONOSCOPY (N/A )  Patient Location: PACU  Anesthesia Type:General  Level of Consciousness: awake, alert  and oriented  Airway & Oxygen Therapy: Patient Spontanous Breathing and Patient connected to nasal cannula oxygen  Post-op Assessment: Report given to RN and Post -op Vital signs reviewed and stable  Post vital signs: Reviewed and stable  Last Vitals:  Vitals Value Taken Time  BP    Temp    Pulse    Resp    SpO2      Last Pain:  Vitals:   05/24/20 1255  TempSrc: Temporal  PainSc: 0-No pain         Complications: No complications documented.

## 2020-05-24 NOTE — Interval H&P Note (Signed)
History and Physical Interval Note:  05/24/2020 1:49 PM  Casey Hatfield  has presented today for surgery, with the diagnosis of COLON CANCER SCREENING.  The various methods of treatment have been discussed with the patient and family. After consideration of risks, benefits and other options for treatment, the patient has consented to  Procedure(s): COLONOSCOPY (N/A) as a surgical intervention.  The patient's history has been reviewed, patient examined, no change in status, stable for surgery.  I have reviewed the patient's chart and labs.  Questions were answered to the patient's satisfaction.     Regis Bill  Ok to proceed with colonoscopy

## 2021-03-25 ENCOUNTER — Other Ambulatory Visit: Payer: Self-pay | Admitting: Family Medicine

## 2021-03-25 DIAGNOSIS — Z1231 Encounter for screening mammogram for malignant neoplasm of breast: Secondary | ICD-10-CM

## 2021-03-31 ENCOUNTER — Other Ambulatory Visit: Payer: Self-pay

## 2021-03-31 ENCOUNTER — Ambulatory Visit
Admission: RE | Admit: 2021-03-31 | Discharge: 2021-03-31 | Disposition: A | Discharge status: Home / Self Care | Source: Ambulatory Visit | Attending: Family Medicine | Admitting: Family Medicine

## 2021-03-31 DIAGNOSIS — Z1231 Encounter for screening mammogram for malignant neoplasm of breast: Secondary | ICD-10-CM | POA: Insufficient documentation

## 2022-09-29 ENCOUNTER — Other Ambulatory Visit: Payer: Self-pay | Admitting: Family Medicine

## 2022-09-29 DIAGNOSIS — Z1231 Encounter for screening mammogram for malignant neoplasm of breast: Secondary | ICD-10-CM

## 2022-10-08 ENCOUNTER — Inpatient Hospital Stay: Admission: RE | Admit: 2022-10-08 | Source: Ambulatory Visit

## 2022-10-16 ENCOUNTER — Ambulatory Visit
Admission: RE | Admit: 2022-10-16 | Discharge: 2022-10-16 | Disposition: A | Discharge status: Home / Self Care | Source: Ambulatory Visit | Attending: Family Medicine | Admitting: Family Medicine

## 2022-10-16 DIAGNOSIS — Z1231 Encounter for screening mammogram for malignant neoplasm of breast: Secondary | ICD-10-CM

## 2022-10-21 ENCOUNTER — Other Ambulatory Visit: Payer: Self-pay | Admitting: Family Medicine

## 2022-10-21 DIAGNOSIS — N6489 Other specified disorders of breast: Secondary | ICD-10-CM

## 2022-10-21 DIAGNOSIS — R928 Other abnormal and inconclusive findings on diagnostic imaging of breast: Secondary | ICD-10-CM

## 2022-10-26 ENCOUNTER — Ambulatory Visit
Admission: RE | Admit: 2022-10-26 | Discharge: 2022-10-26 | Disposition: A | Discharge status: Home / Self Care | Source: Ambulatory Visit | Attending: Family Medicine | Admitting: Family Medicine

## 2022-10-26 ENCOUNTER — Ambulatory Visit: Admission: RE | Admit: 2022-10-26 | Source: Ambulatory Visit

## 2022-10-26 DIAGNOSIS — R928 Other abnormal and inconclusive findings on diagnostic imaging of breast: Secondary | ICD-10-CM | POA: Insufficient documentation

## 2022-10-26 DIAGNOSIS — N6489 Other specified disorders of breast: Secondary | ICD-10-CM | POA: Insufficient documentation

## 2022-10-28 ENCOUNTER — Other Ambulatory Visit: Payer: Self-pay | Admitting: Family Medicine

## 2022-10-28 DIAGNOSIS — N63 Unspecified lump in unspecified breast: Secondary | ICD-10-CM

## 2022-10-28 DIAGNOSIS — R928 Other abnormal and inconclusive findings on diagnostic imaging of breast: Secondary | ICD-10-CM

## 2022-10-29 ENCOUNTER — Other Ambulatory Visit

## 2022-10-29 ENCOUNTER — Encounter

## 2022-11-05 ENCOUNTER — Ambulatory Visit
Admission: RE | Admit: 2022-11-05 | Discharge: 2022-11-05 | Disposition: A | Discharge status: Home / Self Care | Source: Ambulatory Visit | Attending: Family Medicine | Admitting: Family Medicine

## 2022-11-05 DIAGNOSIS — N63 Unspecified lump in unspecified breast: Secondary | ICD-10-CM | POA: Diagnosis present

## 2022-11-05 DIAGNOSIS — R928 Other abnormal and inconclusive findings on diagnostic imaging of breast: Secondary | ICD-10-CM

## 2022-11-05 HISTORY — PX: BREAST BIOPSY: SHX20

## 2022-11-05 MED ORDER — LIDOCAINE-EPINEPHRINE 1 %-1:100000 IJ SOLN
5.0000 mL | Freq: Once | INTRAMUSCULAR | Status: AC
  Administered 2022-11-05: 5 mL
  Filled 2022-11-05: qty 5

## 2022-11-05 MED ORDER — LIDOCAINE 1 % OPTIME INJ - NO CHARGE
2.0000 mL | Freq: Once | INTRAMUSCULAR | Status: AC
  Administered 2022-11-05: 2 mL via INTRADERMAL
  Filled 2022-11-05: qty 2
